# Patient Record
Sex: Female | Born: 1950 | Race: Black or African American | Hispanic: No | Marital: Single | Smoking: Never smoker
Health system: Southern US, Community
[De-identification: ages and names within clinical notes are randomized; demographics above are authoritative.]

## PROBLEM LIST (undated history)

## (undated) DIAGNOSIS — I10 Essential (primary) hypertension: Secondary | ICD-10-CM

## (undated) DIAGNOSIS — K219 Gastro-esophageal reflux disease without esophagitis: Secondary | ICD-10-CM

## (undated) DIAGNOSIS — R112 Nausea with vomiting, unspecified: Secondary | ICD-10-CM

## (undated) DIAGNOSIS — E119 Type 2 diabetes mellitus without complications: Secondary | ICD-10-CM

## (undated) DIAGNOSIS — E0590 Thyrotoxicosis, unspecified without thyrotoxic crisis or storm: Secondary | ICD-10-CM

## (undated) DIAGNOSIS — Z8669 Personal history of other diseases of the nervous system and sense organs: Secondary | ICD-10-CM

## (undated) DIAGNOSIS — E0500 Thyrotoxicosis with diffuse goiter without thyrotoxic crisis or storm: Secondary | ICD-10-CM

## (undated) DIAGNOSIS — Z860100 Personal history of colon polyps, unspecified: Secondary | ICD-10-CM

## (undated) DIAGNOSIS — Z820 Family history of epilepsy and other diseases of the nervous system: Secondary | ICD-10-CM

## (undated) DIAGNOSIS — R7303 Prediabetes: Secondary | ICD-10-CM

## (undated) DIAGNOSIS — I471 Supraventricular tachycardia, unspecified: Secondary | ICD-10-CM

## (undated) DIAGNOSIS — D649 Anemia, unspecified: Secondary | ICD-10-CM

## (undated) DIAGNOSIS — Z9889 Other specified postprocedural states: Secondary | ICD-10-CM

## (undated) DIAGNOSIS — M1990 Unspecified osteoarthritis, unspecified site: Secondary | ICD-10-CM

## (undated) DIAGNOSIS — T7840XA Allergy, unspecified, initial encounter: Secondary | ICD-10-CM

## (undated) DIAGNOSIS — K589 Irritable bowel syndrome without diarrhea: Secondary | ICD-10-CM

## (undated) HISTORY — DX: Unspecified osteoarthritis, unspecified site: M19.90

## (undated) HISTORY — DX: Personal history of colon polyps, unspecified: Z86.0100

## (undated) HISTORY — PX: POLYPECTOMY: SHX149

## (undated) HISTORY — DX: Thyrotoxicosis with diffuse goiter without thyrotoxic crisis or storm: E05.00

## (undated) HISTORY — DX: Irritable bowel syndrome, unspecified: K58.9

## (undated) HISTORY — DX: Family history of epilepsy and other diseases of the nervous system: Z82.0

## (undated) HISTORY — PX: ABDOMINAL HYSTERECTOMY: SHX81

## (undated) HISTORY — PX: TONSILLECTOMY: SUR1361

## (undated) HISTORY — DX: Anemia, unspecified: D64.9

## (undated) HISTORY — DX: Allergy, unspecified, initial encounter: T78.40XA

## (undated) HISTORY — PX: OTHER SURGICAL HISTORY: SHX169

## (undated) HISTORY — PX: COLONOSCOPY: SHX174

## (undated) HISTORY — DX: Gastro-esophageal reflux disease without esophagitis: K21.9

## (undated) HISTORY — DX: Type 2 diabetes mellitus without complications: E11.9

## (undated) HISTORY — DX: Personal history of colonic polyps: Z86.010

## (undated) HISTORY — PX: BREAST BIOPSY: SHX20

## (undated) HISTORY — DX: Thyrotoxicosis, unspecified without thyrotoxic crisis or storm: E05.90

## (undated) HISTORY — DX: Essential (primary) hypertension: I10

---

## 1978-11-24 HISTORY — PX: TUBAL LIGATION: SHX77

## 1998-11-24 HISTORY — PX: CARPAL TUNNEL RELEASE: SHX101

## 2011-08-28 ENCOUNTER — Encounter: Payer: Self-pay | Admitting: Internal Medicine

## 2011-09-18 ENCOUNTER — Encounter: Payer: Self-pay | Admitting: Internal Medicine

## 2011-09-19 ENCOUNTER — Encounter: Payer: Self-pay | Admitting: Internal Medicine

## 2011-09-22 ENCOUNTER — Encounter: Payer: Self-pay | Admitting: *Deleted

## 2011-09-23 ENCOUNTER — Ambulatory Visit (INDEPENDENT_AMBULATORY_CARE_PROVIDER_SITE_OTHER): Payer: BC Managed Care – PPO | Admitting: Internal Medicine

## 2011-09-23 ENCOUNTER — Other Ambulatory Visit (INDEPENDENT_AMBULATORY_CARE_PROVIDER_SITE_OTHER): Payer: BC Managed Care – PPO

## 2011-09-23 ENCOUNTER — Encounter: Payer: Self-pay | Admitting: Internal Medicine

## 2011-09-23 VITALS — BP 142/74 | HR 88 | Ht 61.0 in | Wt 194.2 lb

## 2011-09-23 DIAGNOSIS — D649 Anemia, unspecified: Secondary | ICD-10-CM | POA: Insufficient documentation

## 2011-09-23 DIAGNOSIS — G43909 Migraine, unspecified, not intractable, without status migrainosus: Secondary | ICD-10-CM | POA: Insufficient documentation

## 2011-09-23 DIAGNOSIS — E0500 Thyrotoxicosis with diffuse goiter without thyrotoxic crisis or storm: Secondary | ICD-10-CM | POA: Insufficient documentation

## 2011-09-23 DIAGNOSIS — I10 Essential (primary) hypertension: Secondary | ICD-10-CM | POA: Insufficient documentation

## 2011-09-23 DIAGNOSIS — K219 Gastro-esophageal reflux disease without esophagitis: Secondary | ICD-10-CM | POA: Insufficient documentation

## 2011-09-23 DIAGNOSIS — Z86010 Personal history of colonic polyps: Secondary | ICD-10-CM

## 2011-09-23 LAB — CBC WITH DIFFERENTIAL/PLATELET
Basophils Relative: 1.2 % (ref 0.0–3.0)
Eosinophils Relative: 1.8 % (ref 0.0–5.0)
Lymphocytes Relative: 46.4 % — ABNORMAL HIGH (ref 12.0–46.0)
MCV: 72.2 fl — ABNORMAL LOW (ref 78.0–100.0)
Monocytes Absolute: 0.3 10*3/uL (ref 0.1–1.0)
Monocytes Relative: 8.2 % (ref 3.0–12.0)
Neutrophils Relative %: 42.4 % — ABNORMAL LOW (ref 43.0–77.0)
Platelets: 237 10*3/uL (ref 150.0–400.0)
RBC: 5.07 Mil/uL (ref 3.87–5.11)
WBC: 4.1 10*3/uL — ABNORMAL LOW (ref 4.5–10.5)

## 2011-09-23 LAB — FERRITIN: Ferritin: 85.5 ng/mL (ref 10.0–291.0)

## 2011-09-23 LAB — IBC PANEL
Iron: 58 ug/dL (ref 42–145)
Transferrin: 304.6 mg/dL (ref 212.0–360.0)

## 2011-09-23 MED ORDER — PEG-KCL-NACL-NASULF-NA ASC-C 100 G PO SOLR: 1.0000 | Freq: Once | ORAL | Status: DC

## 2011-09-23 MED ORDER — METOCLOPRAMIDE HCL 10 MG PO TABS: ORAL_TABLET | ORAL | Status: DC

## 2011-09-23 NOTE — Patient Instructions (Signed)
Your physician has requested that you go to the basement for the following lab work before leaving today: IBC panel, celiac Panel and CBC  We have sent the following medications to your pharmacy for you to pick up at your convenience: Moviprep and Reglan  You have been scheduled for a colonoscopy. Please follow written instructions given to you at your visit today.  Please pick up your prep kit at the pharmacy within the next 2-3 days.

## 2011-09-23 NOTE — Progress Notes (Signed)
Subjective:    Patient ID: Denise Ferguson, female    DOB: June 02, 1951, 60 y.o.   MRN: [id number]  HPI Denise Ferguson is a 60 yo female with PMH of Graves' disease, GERD, hypertension, and mild anemia who seen in consultation at the request of Zoe Lan, NP-C for evaluation of anemia with history of colon polyps.  The patient reports she is doing well today. She is having intermittent heartburn, and was recently given a prescription for Dexilant, but her insurance refused to cover this medication. Therefore approximately one week ago she started Nexium 40 mg daily. She has gotten some benefit from this medication already, but still is having mild heartburn symptoms. She denies dysphagia and odynophagia.  She does report intermittent nausea with associated with her heartburn but no vomiting. Appetite is okay and weight has been stable. Regarding her bowel movements, she reports they have been normal. No bright red blood per rectum or melena. She reports a very rarely she'll have lower abdominal cramping and loose stools, but she has attributed this to IBS. Overall this is a rare event for her. Otherwise no abdominal pain. She also reports a long-standing history of anemia, "since I was real young". She reports that she's had low iron levels in the past and in the past having to take iron intermittently. In the past this has caused some mild upset stomach. She reports having hysterectomy in 1989 for endometriosis.  Of note the patient reports having a screening colonoscopy in 2006 in [place], [place].  At this procedure she reports 2 polyps were removed, and she was told these were benign. She is not sure the recommended repeat interval, but remembers a six-month followup which she did not attend.  Review of Systems Constitutional: Negative for fever, chills, night sweats, activity change, appetite change and unexpected weight change HEENT: Negative for sore throat, mouth sores and trouble  swallowing. Eyes: Negative for visual disturbance Respiratory: Negative for chest tightness, positive for shortness of breath and cough Cardiovascular: Negative for chest pain, palpitations and lower extremity swelling Gastrointestinal: See history of present illness Genitourinary: Negative for dysuria and hematuria. Musculoskeletal: Positive for back pain, negative for other arthralgias and myalgias, Positive for muscle cramps Skin: Negative for rash or color change Neurological: Negative for weakness, numbness, positive for headaches Hematological: Negative for adenopathy, negative for easy bruising/bleeding Psychiatric/behavioral: Negative for depressed mood, negative for anxiety  Past Medical History  Diagnosis Date  . FHx: migraine headaches   . Hypertension   . Anemia   . History of colon polyps   . Hyperthyroidism   . Grave's disease   . GERD (gastroesophageal reflux disease)    Current Outpatient Prescriptions  Medication Sig Dispense Refill  . atenolol (TENORMIN) 50 MG tablet Take 50 mg by mouth daily.        Marland Kitchen esomeprazole (NEXIUM) 40 MG capsule Take 40 mg by mouth daily before breakfast.        . estrogens, conjugated, (PREMARIN) 0.9 MG tablet Take 0.9 mg by mouth daily. Take daily for 21 days then do not take for 7 days.       Marland Kitchen levothyroxine (SYNTHROID, LEVOTHROID) 50 MCG tablet Take 50 mcg by mouth every other day.        . levothyroxine (SYNTHROID, LEVOTHROID) 75 MCG tablet Take 75 mcg by mouth every other day.       . losartan-hydrochlorothiazide (HYZAAR) 100-25 MG per tablet Take 1 tablet by mouth daily.        Marland Kitchen  metoCLOPramide (REGLAN) 10 MG tablet Take as directed on prep instructions  4 tablet  0  . peg 3350 powder (MOVIPREP) 100 G SOLR Take 1 kit (100 g total) by mouth once.  1 kit  0   Allergies  Allergen Reactions  . Soma (Carisoprodol)     Nausea, vomiting, swelling  . Ultram (Tramadol Hcl)     vomiting    Social History  . Marital Status: Single     Number of Children: 1   Occupational History  . retired Designer, industrial/product    Social History Main Topics  . Smoking status: Never Smoker   . Smokeless tobacco: Never Used  . Alcohol Use: No  . Drug Use: No    Family History  Problem Relation Age of Onset  . Hypertension Mother   . Diabetes Father   . Lung cancer    . Diabetes Brother   . Hypertension Father   . Alcohol abuse      uncle  . Cancer      uncle       Objective:   Physical Exam BP 142/74  Pulse 88  Ht 5\' 1"  (1.549 m)  Wt 194 lb 3.2 oz (88.089 kg)  BMI 36.69 kg/m2 Constitutional: Well-developed and well-nourished. No distress. HEENT: Normocephalic and atraumatic. Oropharynx is clear and moist. No oropharyngeal exudate. Conjunctivae are normal. Pupils are equal round and reactive to light. No scleral icterus. Neck: Neck supple. Trachea midline. Cardiovascular: Normal rate, regular rhythm and intact distal pulses. No M/R/G Pulmonary/chest: Effort normal and breath sounds normal. No wheezing, rales or rhonchi. Abdominal: Soft, obese, nontender, nondistended. Bowel sounds active throughout. There are no masses palpable. No hepatosplenomegaly. Extremities: no clubbing, cyanosis, or edema Lymphadenopathy: No cervical adenopathy noted. Neurological: Alert and oriented to person place and time. Skin: Skin is warm and dry. No rashes noted. Psychiatric: Normal mood and affect. Behavior is normal.  Labs: 08/27/11 WBC 4.8, hemoglobin 11.8, hematocrit 38.4, platelet 229, MCV 73.8, MCHC 30.8 TSH 5.8, free T4  1.18, free T3 2.4 Sodium 137 potassium 4.4 chloride 101 CO2 32 BUN 14 creatinine 0.9 glucose 89 Total bili 0.5 alk phosphatase 42 AST 25 ALT 20 Albumin 4.1 calcium 9.4 total protein 7.3     Assessment & Plan:  60 yo female with PMH of Graves' disease, GERD, hypertension, and mild anemia who seen in consultation at the request of Zoe Lan, NP-C for evaluation of anemia with history of colon polyps  1. Hx of colon  polyps/anemia -- the patient does have a very mild anemia, and her MCV is low suggesting possible ongoing iron deficiency. She had a hysterectomy in the past and no other source for blood loss or to explain her iron deficiency. One slightly reassuring factor is she states this has been an ongoing problem throughout her lifetime, which suggests a more benign etiology. However, given her history of colon polyps it is very reasonable to repeat colonoscopy at this point. We have requested her procedure and pathology report from 2006. We discussed colonoscopy today and she is willing to proceed. She did state she had some trouble with nausea during her last prep and therefore I will recommend metoclopramide 10 mg 30 minutes to one hour before each half of her prep. If nothing is found on the colonoscopy to explain iron deficiency, then we will consider upper endoscopy. I will repeat his CBC today along with an iron panel and celiac panel.  2. GERD -- the patient's reflux has started to  respond to Nexium 40 mg daily. This is a new medication for her and for now I recommend that she take this medicine for a bit longer before we determine for sure if she is refractory to once daily PPI.  If she does have refractory symptoms, an EGD would be warranted. As stated above EGD may also be warranted for unexplained iron deficiency after colonoscopy.

## 2011-09-24 LAB — TISSUE TRANSGLUTAMINASE, IGA: Tissue Transglutaminase Ab, IgA: 4.2 U/mL (ref <20)

## 2011-09-25 ENCOUNTER — Ambulatory Visit: Payer: BC Managed Care – PPO

## 2011-09-25 DIAGNOSIS — D649 Anemia, unspecified: Secondary | ICD-10-CM

## 2011-09-26 LAB — IGA: IgA: 211 mg/dL (ref 69–380)

## 2011-09-30 ENCOUNTER — Telehealth: Payer: Self-pay | Admitting: *Deleted

## 2011-09-30 NOTE — Telephone Encounter (Signed)
Notified Denise Ferguson of Dr Lauro Franklin findings. Denise Ferguson stated she will decline the Integra for now because it messes up her stomach.

## 2011-09-30 NOTE — Telephone Encounter (Signed)
Message copied by Florene Glen on Tue Sep 30, 2011  3:21 PM ------      Message from: Beverley Fiedler      Created: Sun Sep 28, 2011  9:12 PM       Very very mild anemia with slightly low HCT.  Iron panel is consistent with likely early iron def, but ferritin (iron storage protein) is still normal.      Proceed as discussed in clinic.  Consider Integra once daily.      Celiac panel neg.

## 2011-09-30 NOTE — Telephone Encounter (Signed)
lmom for pt to call back with lab results.

## 2011-10-24 ENCOUNTER — Ambulatory Visit (AMBULATORY_SURGERY_CENTER): Payer: BC Managed Care – PPO | Admitting: Internal Medicine

## 2011-10-24 ENCOUNTER — Encounter: Payer: Self-pay | Admitting: Internal Medicine

## 2011-10-24 VITALS — BP 160/63 | HR 66 | Temp 96.5°F | Resp 24 | Ht 61.0 in | Wt 194.0 lb

## 2011-10-24 DIAGNOSIS — D649 Anemia, unspecified: Secondary | ICD-10-CM

## 2011-10-24 DIAGNOSIS — D126 Benign neoplasm of colon, unspecified: Secondary | ICD-10-CM

## 2011-10-24 MED ORDER — SODIUM CHLORIDE 0.9 % IV SOLN: 500.0000 mL | INTRAVENOUS | Status: DC

## 2011-10-24 NOTE — Patient Instructions (Signed)
Discharge instructions given with verbal understanding. Handouts on polyps, diverticulosis, and a high fiber diet given. Resume previous medications.

## 2011-10-24 NOTE — Progress Notes (Signed)
Patient did not experience any of the following events: a burn prior to discharge; a fall within the facility; wrong site/side/patient/procedure/implant event; or a hospital transfer or hospital admission upon discharge from the facility. (G8907) Patient did not have preoperative order for IV antibiotic SSI prophylaxis. (G8918)  

## 2011-10-24 NOTE — Progress Notes (Signed)
Pt tolerated the colonoscopy very well. maw 

## 2011-10-27 ENCOUNTER — Telehealth: Payer: Self-pay

## 2011-10-27 NOTE — Telephone Encounter (Signed)
Left message

## 2011-10-29 ENCOUNTER — Encounter: Payer: Self-pay | Admitting: Internal Medicine

## 2014-08-29 ENCOUNTER — Encounter: Payer: Self-pay | Admitting: Internal Medicine

## 2015-02-26 ENCOUNTER — Encounter: Payer: Self-pay | Admitting: Internal Medicine

## 2015-03-20 ENCOUNTER — Encounter: Payer: Self-pay | Admitting: Internal Medicine

## 2015-05-18 ENCOUNTER — Ambulatory Visit (AMBULATORY_SURGERY_CENTER): Payer: Self-pay | Admitting: *Deleted

## 2015-05-18 VITALS — Ht 61.75 in | Wt 201.0 lb

## 2015-05-18 DIAGNOSIS — Z86010 Personal history of colonic polyps: Secondary | ICD-10-CM

## 2015-05-18 MED ORDER — NA SULFATE-K SULFATE-MG SULF 17.5-3.13-1.6 GM/177ML PO SOLN: 1.0000 | Freq: Once | ORAL | Status: DC

## 2015-05-18 NOTE — Progress Notes (Signed)
No egg or soy allergy. No anesthesia problems.  No home O2.  No diet meds.  

## 2015-05-31 ENCOUNTER — Ambulatory Visit (AMBULATORY_SURGERY_CENTER): Payer: Medicare HMO | Admitting: Internal Medicine

## 2015-05-31 ENCOUNTER — Encounter: Payer: Self-pay | Admitting: Internal Medicine

## 2015-05-31 VITALS — BP 114/59 | HR 65 | Temp 97.4°F | Resp 17 | Ht 61.0 in | Wt 202.0 lb

## 2015-05-31 DIAGNOSIS — Z860100 Personal history of colon polyps, unspecified: Secondary | ICD-10-CM

## 2015-05-31 DIAGNOSIS — D125 Benign neoplasm of sigmoid colon: Secondary | ICD-10-CM

## 2015-05-31 DIAGNOSIS — K635 Polyp of colon: Secondary | ICD-10-CM

## 2015-05-31 MED ORDER — SODIUM CHLORIDE 0.9 % IV SOLN: 500.0000 mL | INTRAVENOUS | Status: DC

## 2015-05-31 NOTE — Progress Notes (Signed)
Called to room to assist during endoscopic procedure.  Patient ID and intended procedure confirmed with present staff. Received instructions for my participation in the procedure from the performing physician.  

## 2015-05-31 NOTE — Op Note (Signed)
[facility]  [address]. [place], [id number]   COLONOSCOPY PROCEDURE REPORT  PATIENT: Denise Ferguson, Denise Ferguson  MR#: [id number] BIRTHDATE: 1951/03/11 , 28  yrs. old GENDER: female ENDOSCOPIST: Jerene Bears, MD PROCEDURE DATE:  05/31/2015 PROCEDURE:   Colonoscopy, surveillance and Colonoscopy with cold biopsy polypectomy First Screening Colonoscopy - Avg.  risk and is 50 yrs.  old or older - No.  Prior Negative Screening - Now for repeat screening. N/A  History of Adenoma - Now for follow-up colonoscopy & has been > or = to 3 yrs.  Yes hx of adenoma.  Has been 3 or more years since last colonoscopy.  Polyps removed today? Yes ASA CLASS:   Class III INDICATIONS:Screening for colonic neoplasia and PH Colon Adenoma (tubulovillous adenoma 2012). MEDICATIONS: Monitored anesthesia care and Propofol 300 mg IV  DESCRIPTION OF PROCEDURE:   After the risks benefits and alternatives of the procedure were thoroughly explained, informed consent was obtained.  The digital rectal exam revealed no rectal mass.   The LB [id number]  endoscope was introduced through the anus and advanced to the cecum, which was identified by both the appendix and ileocecal valve. No adverse events experienced. The quality of the prep was fair requiring copious irrigation and lavage.  (Suprep was used)  The instrument was then slowly withdrawn as the colon was fully examined. Estimated blood loss is zero unless otherwise noted in this procedure report.   COLON FINDINGS: A sessile polyp measuring 3 mm in size was found in the sigmoid colon.  A polypectomy was performed with cold forceps. The resection was complete, the polyp tissue was completely retrieved and sent to histology.   There was moderate diverticulosis noted in the descending colon and sigmoid colon. Retroflexed views revealed internal hemorrhoids. The time to cecum = 4.2 Withdrawal time = 15.3   The scope was withdrawn and  the procedure completed. COMPLICATIONS: There were no immediate complications.  ENDOSCOPIC IMPRESSION: 1.   Sessile polyp was found in the sigmoid colon; polypectomy was performed with cold forceps 2.   Moderate diverticulosis was noted in the descending colon and sigmoid colon  RECOMMENDATIONS: 1.  Await pathology results 2.  High fiber diet 3.  Repeat Colonoscopy in 5 years. 4.  You will receive a letter within 1-2 weeks with the results of your biopsy as well as final recommendations.  Please call my office if you have not received a letter after 3 weeks.  eSigned:  Jerene Bears, MD 05/31/2015 8:34 AM   cc: Eldridge Abrahams MD and The Patient

## 2015-05-31 NOTE — Patient Instructions (Signed)
YOU HAD AN ENDOSCOPIC PROCEDURE TODAY AT [facility]:   Refer to the procedure report that was given to you for any specific questions about what was found during the examination.  If the procedure report does not answer your questions, please call your gastroenterologist to clarify.  If you requested that your care partner not be given the details of your procedure findings, then the procedure report has been included in a sealed envelope for you to review at your convenience later.  YOU SHOULD EXPECT: Some feelings of bloating in the abdomen. Passage of more gas than usual.  Walking can help get rid of the air that was put into your GI tract during the procedure and reduce the bloating. If you had a lower endoscopy (such as a colonoscopy or flexible sigmoidoscopy) you may notice spotting of blood in your stool or on the toilet paper. If you underwent a bowel prep for your procedure, you may not have a normal bowel movement for a few days.  Please Note:  You might notice some irritation and congestion in your nose or some drainage.  This is from the oxygen used during your procedure.  There is no need for concern and it should clear up in a day or so.  SYMPTOMS TO REPORT IMMEDIATELY:   Following lower endoscopy (colonoscopy or flexible sigmoidoscopy):  Excessive amounts of blood in the stool  Significant tenderness or worsening of abdominal pains  Swelling of the abdomen that is new, acute  Fever of 100F or higher   For urgent or emergent issues, a gastroenterologist can be reached at any hour by calling [phone].   DIET: Your first meal following the procedure should be a small meal and then it is ok to progress to your normal diet. Heavy or fried foods are harder to digest and may make you feel nauseous or bloated.  Likewise, meals heavy in dairy and vegetables can increase bloating.  Drink plenty of fluids but you should avoid alcoholic beverages for 24  hours.  ACTIVITY:  You should plan to take it easy for the rest of today and you should NOT DRIVE or use heavy machinery until tomorrow (because of the sedation medicines used during the test).    FOLLOW UP: Our staff will call the number listed on your records the next business day following your procedure to check on you and address any questions or concerns that you may have regarding the information given to you following your procedure. If we do not reach you, we will leave a message.  However, if you are feeling well and you are not experiencing any problems, there is no need to return our call.  We will assume that you have returned to your regular daily activities without incident.  If any biopsies were taken you will be contacted by phone or by letter within the next 1-3 weeks.  Please call us at [phone] if you have not heard about the biopsies in 3 weeks.    SIGNATURES/CONFIDENTIALITY: You and/or your care partner have signed paperwork which will be entered into your electronic medical record.  These signatures attest to the fact that that the information above on your After Visit Summary has been reviewed and is understood.  Full responsibility of the confidentiality of this discharge information lies with you and/or your care-partner.  Polyp, diverticulosis, high fiber diet-handouts given  Repeat colonoscopy in 5 years 2021.

## 2015-05-31 NOTE — Progress Notes (Signed)
Patient awakening vss, report to rn 

## 2015-06-01 ENCOUNTER — Telehealth: Payer: Self-pay | Admitting: *Deleted

## 2015-06-01 NOTE — Telephone Encounter (Signed)
  Follow up Call-  Call back number 05/31/2015  Post procedure Call Back phone  # [phone]  Permission to leave phone message Yes     Patient questions:  Do you have a fever, pain , or abdominal swelling? No. Pain Score  0 *  Have you tolerated food without any problems? Yes.    Have you been able to return to your normal activities? Yes.    Do you have any questions about your discharge instructions: Diet   No. Medications  No. Follow up visit  No.  Do you have questions or concerns about your Care? No.  Actions: * If pain score is 4 or above: No action needed, pain <4.

## 2015-06-07 ENCOUNTER — Encounter: Payer: Self-pay | Admitting: Internal Medicine

## 2018-09-30 ENCOUNTER — Other Ambulatory Visit: Payer: Self-pay | Admitting: Endocrinology

## 2018-09-30 DIAGNOSIS — E041 Nontoxic single thyroid nodule: Secondary | ICD-10-CM

## 2018-10-13 ENCOUNTER — Ambulatory Visit
Admission: RE | Admit: 2018-10-13 | Discharge: 2018-10-13 | Disposition: A | Discharge status: Home / Self Care | Payer: Medicare HMO | Source: Ambulatory Visit | Attending: Endocrinology | Admitting: Endocrinology

## 2018-10-13 DIAGNOSIS — E041 Nontoxic single thyroid nodule: Secondary | ICD-10-CM

## 2020-06-25 ENCOUNTER — Encounter: Payer: Self-pay | Admitting: Internal Medicine

## 2020-08-16 ENCOUNTER — Ambulatory Visit (AMBULATORY_SURGERY_CENTER): Payer: Self-pay | Admitting: *Deleted

## 2020-08-16 ENCOUNTER — Other Ambulatory Visit: Payer: Self-pay

## 2020-08-16 VITALS — Ht 61.0 in | Wt 203.0 lb

## 2020-08-16 DIAGNOSIS — Z86010 Personal history of colonic polyps: Secondary | ICD-10-CM

## 2020-08-16 MED ORDER — SUPREP BOWEL PREP KIT 17.5-3.13-1.6 GM/177ML PO SOLN: 1.0000 | Freq: Once | ORAL | 0 refills | Status: AC

## 2020-08-16 NOTE — Progress Notes (Signed)
cov vax x 2  No egg or soy allergy known to patient  No issues with past sedation with any surgeries or procedures no intubation problems in the past  No FH of Malignant Hyperthermia No diet pills per patient No home 02 use per patient  No blood thinners per patient  Pt denies issues with constipation  No A fib or A flutter  EMMI video to pt or via [facility] 19 guidelines implemented in PV today with Pt and RN   2 day Suprep due to last colon prep fair with lavage   Due to the COVID-19 pandemic we are asking patients to follow these guidelines. Please only bring one care partner. Please be aware that your care partner may wait in the car in the parking lot or if they feel like they will be too hot to wait in the car, they may wait in the lobby on the 4th floor. All care partners are required to wear a mask the entire time (we do not have any that we can provide them), they need to practice social distancing, and we will do a Covid check for all patient's and care partners when you arrive. Also we will check their temperature and your temperature. If the care partner waits in their car they need to stay in the parking lot the entire time and we will call them on their cell phone when the patient is ready for discharge so they can bring the car to the front of the building. Also all patient's will need to wear a mask into building.

## 2020-08-28 ENCOUNTER — Telehealth: Payer: Self-pay | Admitting: Gastroenterology

## 2020-08-28 NOTE — Telephone Encounter (Signed)
Ulice Dash,  This patient called through the answering service regarding problems with her bowel preparation.  She appears to be taking a 2-day bowel preparation for a colonoscopy with you on 08/30/2020.  After taking Dulcolax around 3 PM and then starting MiraLAX about 5 PM, she began vomiting profusely by around 6 PM and does not think she keep down any of her prep.  I recommended she not consume any more prep, wait about 2 hours and, if vomiting is subsided, try to drink some liquids to rehydrate herself.  I told her you would have nursing call in the morning with further instructions regarding a change in the prep instructions and probable prescription for antiemetics to get through it.  - HD

## 2020-08-29 MED ORDER — METOCLOPRAMIDE HCL 10 MG PO TABS: ORAL_TABLET | ORAL | 0 refills | Status: DC

## 2020-08-29 NOTE — Telephone Encounter (Signed)
Denise Ferguson, can you reach out and try to make a plan to get her through the prep. She may need metoclopramide 10 mg 30 min before each 1/2 of prep

## 2020-08-29 NOTE — Telephone Encounter (Signed)
Spoke with pt and she is aware of plan for reglan 75min before prep doses. Script sent to pharmacy.

## 2020-08-30 ENCOUNTER — Other Ambulatory Visit: Payer: Self-pay

## 2020-08-30 ENCOUNTER — Ambulatory Visit (AMBULATORY_SURGERY_CENTER): Payer: Medicare HMO | Admitting: Internal Medicine

## 2020-08-30 ENCOUNTER — Encounter: Payer: Self-pay | Admitting: Internal Medicine

## 2020-08-30 VITALS — BP 123/56 | HR 66 | Temp 97.6°F | Resp 14 | Ht 61.0 in | Wt 203.0 lb

## 2020-08-30 DIAGNOSIS — K635 Polyp of colon: Secondary | ICD-10-CM

## 2020-08-30 DIAGNOSIS — Z86010 Personal history of colonic polyps: Secondary | ICD-10-CM

## 2020-08-30 DIAGNOSIS — D127 Benign neoplasm of rectosigmoid junction: Secondary | ICD-10-CM

## 2020-08-30 MED ORDER — SODIUM CHLORIDE 0.9 % IV SOLN: 500.0000 mL | Freq: Once | INTRAVENOUS | Status: DC

## 2020-08-30 NOTE — Progress Notes (Signed)
Pt's states no medical or surgical changes since previsit or office visit. 

## 2020-08-30 NOTE — Progress Notes (Signed)
Called to room to assist during endoscopic procedure.  Patient ID and intended procedure confirmed with present staff. Received instructions for my participation in the procedure from the performing physician.  

## 2020-08-30 NOTE — Patient Instructions (Signed)
Handouts provided on polyps, diverticulosis and hemorrhoids.   YOU HAD AN ENDOSCOPIC PROCEDURE TODAY AT THE [facility]:   Refer to the procedure report that was given to you for any specific questions about what was found during the examination.  If the procedure report does not answer your questions, please call your gastroenterologist to clarify.  If you requested that your care partner not be given the details of your procedure findings, then the procedure report has been included in a sealed envelope for you to review at your convenience later.  YOU SHOULD EXPECT: Some feelings of bloating in the abdomen. Passage of more gas than usual.  Walking can help get rid of the air that was put into your GI tract during the procedure and reduce the bloating. If you had a lower endoscopy (such as a colonoscopy or flexible sigmoidoscopy) you may notice spotting of blood in your stool or on the toilet paper. If you underwent a bowel prep for your procedure, you may not have a normal bowel movement for a few days.  Please Note:  You might notice some irritation and congestion in your nose or some drainage.  This is from the oxygen used during your procedure.  There is no need for concern and it should clear up in a day or so.  SYMPTOMS TO REPORT IMMEDIATELY:   Following lower endoscopy (colonoscopy or flexible sigmoidoscopy):  Excessive amounts of blood in the stool  Significant tenderness or worsening of abdominal pains  Swelling of the abdomen that is new, acute  Fever of 100F or higher   For urgent or emergent issues, a gastroenterologist can be reached at any hour by calling [phone]. Do not use MyChart messaging for urgent concerns.    DIET:  We do recommend a small meal at first, but then you may proceed to your regular diet.  Drink plenty of fluids but you should avoid alcoholic beverages for 24 hours.  ACTIVITY:  You should plan to take it easy for the rest of today and  you should NOT DRIVE or use heavy machinery until tomorrow (because of the sedation medicines used during the test).    FOLLOW UP: Our staff will call the number listed on your records 48-72 hours following your procedure to check on you and address any questions or concerns that you may have regarding the information given to you following your procedure. If we do not reach you, we will leave a message.  We will attempt to reach you two times.  During this call, we will ask if you have developed any symptoms of COVID 19. If you develop any symptoms (ie: fever, flu-like symptoms, shortness of breath, cough etc.) before then, please call [phone].  If you test positive for Covid 19 in the 2 weeks post procedure, please call and report this information to us.    If any biopsies were taken you will be contacted by phone or by letter within the next 1-3 weeks.  Please call us at [phone] if you have not heard about the biopsies in 3 weeks.    SIGNATURES/CONFIDENTIALITY: You and/or your care partner have signed paperwork which will be entered into your electronic medical record.  These signatures attest to the fact that that the information above on your After Visit Summary has been reviewed and is understood.  Full responsibility of the confidentiality of this discharge information lies with you and/or your care-partner.  

## 2020-08-30 NOTE — Progress Notes (Signed)
Report to PACU, RN, vss, BBS= Clear.  

## 2020-08-30 NOTE — Op Note (Signed)
[facility] Patient Name: Denise Ferguson Procedure Date: 08/30/2020 9:05 AM MRN: [id number] Endoscopist: Jerene Bears , MD Age: 69 Referring MD:  Date of Birth: Apr 09, 1951 Gender: Female Account #: [id number] Procedure:                Colonoscopy Indications:              High risk colon cancer surveillance: Personal                            history of colonic polyps (TVA 2012, no polyps                            2016), Last colonoscopy: July 2016 Medicines:                Monitored Anesthesia Care Procedure:                Pre-Anesthesia Assessment:                           - Prior to the procedure, a History and Physical                            was performed, and patient medications and                            allergies were reviewed. The patient's tolerance of                            previous anesthesia was also reviewed. The risks                            and benefits of the procedure and the sedation                            options and risks were discussed with the patient.                            All questions were answered, and informed consent                            was obtained. Prior Anticoagulants: The patient has                            taken no previous anticoagulant or antiplatelet                            agents. ASA Grade Assessment: II - A patient with                            mild systemic disease. After reviewing the risks                            and benefits, the patient was deemed in  satisfactory condition to undergo the procedure.                           After obtaining informed consent, the colonoscope                            was passed under direct vision. Throughout the                            procedure, the patient's blood pressure, pulse, and                            oxygen saturations were monitored continuously. The                            Colonoscope was introduced  through the anus and                            advanced to the cecum, identified by appendiceal                            orifice and ileocecal valve. The colonoscopy was                            performed without difficulty. The patient tolerated                            the procedure well. The quality of the bowel                            preparation was good. The ileocecal valve,                            appendiceal orifice, and rectum were photographed. Scope In: 9:08:02 AM Scope Out: 9:23:58 AM Scope Withdrawal Time: 0 hours 11 minutes 58 seconds  Total Procedure Duration: 0 hours 15 minutes 56 seconds  Findings:                 The digital rectal exam was normal.                           A 3 mm polyp was found in the recto-sigmoid colon.                            The polyp was sessile. The polyp was removed with a                            cold snare. Resection and retrieval were complete.                           Multiple small-mouthed diverticula were found in                            the sigmoid colon and descending colon.  Internal hemorrhoids were found during                            retroflexion. The hemorrhoids were small. Complications:            No immediate complications. Estimated Blood Loss:     Estimated blood loss was minimal. Impression:               - One 3 mm polyp at the recto-sigmoid colon,                            removed with a cold snare. Resected and retrieved.                           - Diverticulosis in the sigmoid colon and in the                            descending colon.                           - Small internal hemorrhoids. Recommendation:           - Patient has a contact number available for                            emergencies. The signs and symptoms of potential                            delayed complications were discussed with the                            patient. Return to normal activities  tomorrow.                            Written discharge instructions were provided to the                            patient.                           - Resume previous diet.                           - Continue present medications.                           - Await pathology results.                           - Repeat colonoscopy is recommended. The                            colonoscopy date will be determined after pathology                            results from today's exam become available for  review. Jerene Bears, MD 08/30/2020 9:26:51 AM This report has been signed electronically.

## 2020-09-03 ENCOUNTER — Telehealth: Payer: Self-pay

## 2020-09-03 NOTE — Telephone Encounter (Signed)
°  Follow up Call-  Call back number 08/30/2020  Post procedure Call Back phone  # [phone]  Permission to leave phone message Yes  Some recent data might be hidden     Patient questions:  Do you have a fever, pain , or abdominal swelling? No. Pain Score  0 *  Have you tolerated food without any problems? Yes.    Have you been able to return to your normal activities? Yes.    Do you have any questions about your discharge instructions: Diet   No. Medications  No. Follow up visit  No.  Do you have questions or concerns about your Care? No.  Actions: * If pain score is 4 or above: No action needed, pain <4.  Have you developed a fever since your procedure? No 2.   Have you had an respiratory symptoms (SOB or cough) since your procedure? No  3.   Have you tested positive for COVID 19 since your procedure No  4.   Have you had any family members/close contacts diagnosed with the COVID 19 since your procedure?  No  If yes to any of these questions please route to Joylene John, RN and Joella Prince, RN

## 2020-09-04 ENCOUNTER — Encounter: Payer: Self-pay | Admitting: Internal Medicine

## 2021-03-20 HISTORY — PX: CATARACT EXTRACTION: SUR2

## 2021-05-16 ENCOUNTER — Ambulatory Visit: Payer: Medicare HMO | Admitting: Physician Assistant

## 2021-05-16 ENCOUNTER — Ambulatory Visit (INDEPENDENT_AMBULATORY_CARE_PROVIDER_SITE_OTHER): Payer: Medicare HMO

## 2021-05-16 ENCOUNTER — Encounter: Payer: Self-pay | Admitting: Orthopedic Surgery

## 2021-05-16 DIAGNOSIS — M25561 Pain in right knee: Secondary | ICD-10-CM

## 2021-05-16 MED ORDER — METHYLPREDNISOLONE ACETATE 40 MG/ML IJ SUSP
40.0000 mg | INTRAMUSCULAR | Status: AC | PRN
  Administered 2021-05-16: 40 mg via INTRA_ARTICULAR

## 2021-05-16 MED ORDER — LIDOCAINE HCL 1 % IJ SOLN
5.0000 mL | INTRAMUSCULAR | Status: AC | PRN
  Administered 2021-05-16: 5 mL

## 2021-05-16 NOTE — Progress Notes (Signed)
Office Visit Note   Patient: Denise Ferguson           Date of Birth: [birth year]           MRN: [id number] Visit Date: 05/16/2021              Requested by: [name], NP [place],  [place] [address] PCP: [name], NP  Chief Complaint  Patient presents with  . Right Knee - Pain      HPI: Patient is a pleasant 70 year old woman with a chief complaint of right knee pain.  She has a history of ongoing left knee problems but her left knee is not currently very symptomatic.  She has difficulty with stairs.  She sometimes feels popping in the joint.  Sometimes when she rolls a certain way in bed she awakens with pain.  She is prediabetic and takes metformin  Assessment & Plan: Visit Diagnoses:  1. Right knee pain, unspecified chronicity     Plan: We will follow-up in 3 weeks.  I have demonstrated to her close chain quadricep strengthening exercises.  We will go forward with an injection into the right knee today.  If she had persistent mechanical symptoms would recommend an MRI  Follow-Up Instructions: No follow-ups on file.   Ortho Exam  Patient is alert, oriented, no adenopathy, well-dressed, normal affect, normal respiratory effort. Right knee: No effusion mild soft tissue swelling she is tender over the medial and lateral joint line.  No signs of infection.  Endpoint on anterior draw.  Her compartments are soft and nontender.  Good dorsiflexion plantarflexion of her foot  Imaging: No results found. No images are attached to the encounter.  Labs: No results found for: HGBA1C, ESRSEDRATE, CRP, LABURIC, REPTSTATUS, GRAMSTAIN, CULT, LABORGA   No results found for: ALBUMIN, PREALBUMIN, CBC  No results found for: MG No results found for: VD25OH  No results found for: PREALBUMIN CBC EXTENDED Latest Ref Rng & Units 09/23/2011  WBC 4.5 - 10.5 K/uL 4.1(L)  RBC 3.87 - 5.11 Mil/uL 5.07  HGB 12.0 - 15.0 g/dL 11.8(L)  HCT 36.0 - 46.0 % 36.6   PLT 150.0 - 400.0 K/uL 237.0  NEUTROABS 1.4 - 7.7 K/uL 1.8  LYMPHSABS 0.7 - 4.0 K/uL 1.9     There is no height or weight on file to calculate BMI.  Orders:  Orders Placed This Encounter  Procedures  . XR Knee 1-2 Views Right   No orders of the defined types were placed in this encounter.    Procedures: Large Joint Inj: R knee on 05/16/2021 2:36 PM Indications: pain and diagnostic evaluation Details: 22 G 1.5 in needle, anterolateral approach  Arthrogram: No  Medications: 40 mg methylPREDNISolone acetate 40 MG/ML; 5 mL lidocaine 1 % Outcome: tolerated well, no immediate complications Procedure, treatment alternatives, risks and benefits explained, specific risks discussed. Consent was given by the patient.     Clinical Data: No additional findings.  ROS:  All other systems negative, except as noted in the HPI. Review of Systems  Objective: Vital Signs: There were no vitals taken for this visit.  Specialty Comments:  No specialty comments available.  PMFS History: Patient Active Problem List   Diagnosis Date Noted  . History of colon polyps 09/23/2011  . GERD (gastroesophageal reflux disease) 09/23/2011  . Grave's disease 09/23/2011  . HTN (hypertension) 09/23/2011  . Migraines 09/23/2011  . Anemia 09/23/2011   Past Medical History:  Diagnosis Date  .  Allergy   . Anemia   . Arthritis    knees  . Diabetes mellitus without complication ([place])    " Pre" DM- on Metformin   . FHx: migraine headaches   . GERD (gastroesophageal reflux disease)   . Grave's disease   . History of colon polyps   . Hypertension   . Hyperthyroidism   . IBS (irritable bowel syndrome)     Family History  Problem Relation Age of Onset  . Hypertension Mother   . Diabetes Father   . Hypertension Father   . Lung cancer Other   . Diabetes Brother   . Alcohol abuse Other        uncle  . Cancer Other        uncle  . Colon cancer Neg Hx   . Colon polyps Neg Hx   . Esophageal  cancer Neg Hx   . Rectal cancer Neg Hx   . Stomach cancer Neg Hx     Past Surgical History:  Procedure Laterality Date  . ABDOMINAL HYSTERECTOMY    . BREAST BIOPSY    . CARPAL TUNNEL RELEASE  2000   bilateral  . COLONOSCOPY    . POLYPECTOMY    . thyroid ablation     x2  . TONSILLECTOMY    . TUBAL LIGATION  1980   Social History   Occupational History  . Occupation: retired Quarry manager  Tobacco Use  . Smoking status: Never  . Smokeless tobacco: Never  Substance and Sexual Activity  . Alcohol use: No    Alcohol/week: 0.0 standard drinks  . Drug use: No  . Sexual activity: Not on file

## 2021-06-10 ENCOUNTER — Ambulatory Visit (INDEPENDENT_AMBULATORY_CARE_PROVIDER_SITE_OTHER): Payer: Medicare HMO | Admitting: Physician Assistant

## 2021-06-10 DIAGNOSIS — M25561 Pain in right knee: Secondary | ICD-10-CM

## 2021-06-10 NOTE — Progress Notes (Signed)
Office Visit Note   Patient: Denise Ferguson           Date of Birth: [birth year]           MRN: [id number] Visit Date: 06/10/2021              Requested by: [name], NP [place],  [place] [address] PCP: [name], NP  Chief Complaint  Patient presents with   Right Knee - Pain, Follow-up      HPI: Patient is a pleasant 70 year old woman with right knee pain.  At her last visit she was given an injection.  She was also told to try using Voltaren gel on her knee.  Also instructed in close chain quadricep strengthening exercises.  She has had very little relief despite this.  She also has episodes of her knee locking up and feelings of catching within the knee itself.  This causes her some instability  Assessment & Plan: Visit Diagnoses:  1. Right knee pain, unspecified chronicity     Plan: Given her mechanical symptoms I will order an MRI we will follow-up with Dr. Sharol Given once this is complete.  I did discuss with her that for stability she could purchase a knee sleeve at a sporting goods store that may make her feel more comfortable  Follow-Up Instructions: No follow-ups on file.   Ortho Exam  Patient is alert, oriented, no adenopathy, well-dressed, normal affect, normal respiratory effort. Examination of the right knee demonstrates no effusion no swelling no cellulitis.  She does have tenderness over the medial and lateral joint line.  Some tenderness posteriorly.  She does have some popping with extension and flexion of her knee  Imaging: No results found. No images are attached to the encounter.  Labs: No results found for: HGBA1C, ESRSEDRATE, CRP, LABURIC, REPTSTATUS, GRAMSTAIN, CULT, LABORGA   No results found for: ALBUMIN, PREALBUMIN, CBC  No results found for: MG No results found for: VD25OH  No results found for: PREALBUMIN CBC EXTENDED Latest Ref Rng & Units 09/23/2011  WBC 4.5 - 10.5 K/uL 4.1(L)  RBC 3.87 - 5.11 Mil/uL  5.07  HGB 12.0 - 15.0 g/dL 11.8(L)  HCT 36.0 - 46.0 % 36.6  PLT 150.0 - 400.0 K/uL 237.0  NEUTROABS 1.4 - 7.7 K/uL 1.8  LYMPHSABS 0.7 - 4.0 K/uL 1.9     There is no height or weight on file to calculate BMI.  Orders:  Orders Placed This Encounter  Procedures   MR Knee Right w/o contrast   No orders of the defined types were placed in this encounter.    Procedures: No procedures performed  Clinical Data: No additional findings.  ROS:  All other systems negative, except as noted in the HPI. Review of Systems  Objective: Vital Signs: There were no vitals taken for this visit.  Specialty Comments:  No specialty comments available.  PMFS History: Patient Active Problem List   Diagnosis Date Noted   History of colon polyps 09/23/2011   GERD (gastroesophageal reflux disease) 09/23/2011   Grave's disease 09/23/2011   HTN (hypertension) 09/23/2011   Migraines 09/23/2011   Anemia 09/23/2011   Past Medical History:  Diagnosis Date   Allergy    Anemia    Arthritis    knees   Diabetes mellitus without complication ([place])    " Pre" DM- on Metformin    FHx: migraine headaches    GERD (gastroesophageal reflux disease)    Grave's  disease    History of colon polyps    Hypertension    Hyperthyroidism    IBS (irritable bowel syndrome)     Family History  Problem Relation Age of Onset   Hypertension Mother    Diabetes Father    Hypertension Father    Lung cancer Other    Diabetes Brother    Alcohol abuse Other        uncle   Cancer Other        uncle   Colon cancer Neg Hx    Colon polyps Neg Hx    Esophageal cancer Neg Hx    Rectal cancer Neg Hx    Stomach cancer Neg Hx     Past Surgical History:  Procedure Laterality Date   ABDOMINAL HYSTERECTOMY     BREAST BIOPSY     CARPAL TUNNEL RELEASE  2000   bilateral   COLONOSCOPY     POLYPECTOMY     thyroid ablation     x2   TONSILLECTOMY     TUBAL LIGATION  1980   Social History   Occupational History    Occupation: retired Quarry manager  Tobacco Use   Smoking status: Never   Smokeless tobacco: Never  Substance and Sexual Activity   Alcohol use: No    Alcohol/week: 0.0 standard drinks   Drug use: No   Sexual activity: Not on file

## 2021-06-18 ENCOUNTER — Ambulatory Visit
Admission: RE | Admit: 2021-06-18 | Discharge: 2021-06-18 | Disposition: A | Discharge status: Home / Self Care | Payer: Medicare HMO | Source: Ambulatory Visit | Attending: Physician Assistant | Admitting: Physician Assistant

## 2021-06-18 ENCOUNTER — Other Ambulatory Visit: Payer: Medicare HMO

## 2021-06-18 ENCOUNTER — Other Ambulatory Visit: Payer: Self-pay

## 2021-06-18 DIAGNOSIS — M25561 Pain in right knee: Secondary | ICD-10-CM

## 2021-06-20 ENCOUNTER — Ambulatory Visit: Payer: Medicare HMO | Admitting: Orthopedic Surgery

## 2021-06-20 DIAGNOSIS — M23203 Derangement of unspecified medial meniscus due to old tear or injury, right knee: Secondary | ICD-10-CM | POA: Diagnosis not present

## 2021-06-27 ENCOUNTER — Encounter: Payer: Self-pay | Admitting: Orthopedic Surgery

## 2021-06-27 NOTE — Progress Notes (Signed)
Office Visit Note   Patient: Denise Ferguson           Date of Birth: 03-26-1951           MRN: [id number] Visit Date: 06/20/2021              Requested by: [name], NP [place],  [place] [address] PCP: [name], NP  Chief Complaint  Patient presents with   Right Knee - Follow-up      HPI: Patient is a 70 year old woman who presents status post right knee MRI scan.  She complains of mechanical symptoms with her right knee pain with activities of daily living.  Assessment & Plan: Visit Diagnoses:  1. 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  Discussed risk and benefits of surgery discussed that with arthroscopy her pain from the meniscal tears should resolve however she would still have pain from the osteoarthritis patient states she understands wished to proceed at this time.  Follow-Up Instructions: Return if symptoms worsen or fail to improve.   Ortho Exam  Patient is alert, oriented, no adenopathy, well-dressed, normal affect, normal respiratory effort. Examination patient is crepitation with range of motion of the right knee collaterals and cruciates are stable.  She is tender to palpation over the medial and lateral joint line as well as the patellofemoral joint.  She has an antalgic gait.  Review of the MRI scan shows medial and lateral meniscal tears as well as osteoarthritis of the knee.  Imaging: No results found. No images are attached to the encounter.  Labs: No results found for: HGBA1C, ESRSEDRATE, CRP, LABURIC, REPTSTATUS, GRAMSTAIN, CULT, LABORGA   No results found for: ALBUMIN, PREALBUMIN, CBC  No results found for: MG No results found for: VD25OH  No results found for: PREALBUMIN CBC EXTENDED Latest Ref Rng & Units 09/23/2011  WBC 4.5 - 10.5 K/uL  4.1(L)  RBC 3.87 - 5.11 Mil/uL 5.07  HGB 12.0 - 15.0 g/dL 11.8(L)  HCT 36.0 - 46.0 % 36.6  PLT 150.0 - 400.0 K/uL 237.0  NEUTROABS 1.4 - 7.7 K/uL 1.8  LYMPHSABS 0.7 - 4.0 K/uL 1.9     There is no height or weight on file to calculate BMI.  Orders:  No orders of the defined types were placed in this encounter.  No orders of the defined types were placed in this encounter.    Procedures: No procedures performed  Clinical Data: No additional findings.  ROS:  All other systems negative, except as noted in the HPI. Review of Systems  Objective: Vital Signs: There were no vitals taken for this visit.  Specialty Comments:  No specialty comments available.  PMFS History: Patient Active Problem List   Diagnosis Date Noted   History of colon polyps 09/23/2011   GERD (gastroesophageal reflux disease) 09/23/2011   Grave's disease 09/23/2011   HTN (hypertension) 09/23/2011   Migraines 09/23/2011   Anemia 09/23/2011   Past Medical History:  Diagnosis Date   Allergy    Anemia    Arthritis    knees   Diabetes mellitus without complication ([place])    " Pre" DM- on Metformin    FHx: migraine headaches    GERD (gastroesophageal reflux disease)    Grave's disease    History of colon polyps  Hypertension    Hyperthyroidism    IBS (irritable bowel syndrome)     Family History  Problem Relation Age of Onset   Hypertension Mother    Diabetes Father    Hypertension Father    Lung cancer Other    Diabetes Brother    Alcohol abuse Other        uncle   Cancer Other        uncle   Colon cancer Neg Hx    Colon polyps Neg Hx    Esophageal cancer Neg Hx    Rectal cancer Neg Hx    Stomach cancer Neg Hx     Past Surgical History:  Procedure Laterality Date   ABDOMINAL HYSTERECTOMY     BREAST BIOPSY     CARPAL TUNNEL RELEASE  2000   bilateral   COLONOSCOPY     POLYPECTOMY     thyroid ablation     x2   TONSILLECTOMY     TUBAL LIGATION  1980   Social History    Occupational History   Occupation: retired Quarry manager  Tobacco Use   Smoking status: Never   Smokeless tobacco: Never  Substance and Sexual Activity   Alcohol use: No    Alcohol/week: 0.0 standard drinks   Drug use: No   Sexual activity: Not on file

## 2021-07-02 ENCOUNTER — Other Ambulatory Visit: Payer: Self-pay

## 2021-07-09 ENCOUNTER — Encounter (HOSPITAL_BASED_OUTPATIENT_CLINIC_OR_DEPARTMENT_OTHER): Payer: Self-pay | Admitting: Orthopedic Surgery

## 2021-07-09 ENCOUNTER — Other Ambulatory Visit: Payer: Self-pay

## 2021-07-09 ENCOUNTER — Other Ambulatory Visit: Payer: Self-pay | Admitting: Physician Assistant

## 2021-07-12 ENCOUNTER — Encounter (HOSPITAL_BASED_OUTPATIENT_CLINIC_OR_DEPARTMENT_OTHER)
Admission: RE | Admit: 2021-07-12 | Discharge: 2021-07-12 | Disposition: A | Discharge status: Home / Self Care | Payer: Medicare HMO | Source: Ambulatory Visit | Attending: Orthopedic Surgery | Admitting: Orthopedic Surgery

## 2021-07-12 DIAGNOSIS — Z01818 Encounter for other preprocedural examination: Secondary | ICD-10-CM | POA: Diagnosis present

## 2021-07-12 LAB — BASIC METABOLIC PANEL
Anion gap: 7 (ref 5–15)
BUN: 16 mg/dL (ref 8–23)
CO2: 30 mmol/L (ref 22–32)
Calcium: 9.1 mg/dL (ref 8.9–10.3)
Chloride: 102 mmol/L (ref 98–111)
Creatinine, Ser: 0.84 mg/dL (ref 0.44–1.00)
GFR, Estimated: >60 mL/min (ref >60)
Glucose, Bld: 99 mg/dL (ref 70–99)
Potassium: 4.1 mmol/L (ref 3.5–5.1)
Sodium: 139 mmol/L (ref 135–145)

## 2021-07-16 ENCOUNTER — Ambulatory Visit (HOSPITAL_BASED_OUTPATIENT_CLINIC_OR_DEPARTMENT_OTHER): Payer: Medicare HMO | Admitting: Anesthesiology

## 2021-07-16 ENCOUNTER — Encounter (HOSPITAL_BASED_OUTPATIENT_CLINIC_OR_DEPARTMENT_OTHER): Payer: Self-pay | Admitting: Orthopedic Surgery

## 2021-07-16 ENCOUNTER — Ambulatory Visit (HOSPITAL_BASED_OUTPATIENT_CLINIC_OR_DEPARTMENT_OTHER)
Admission: RE | Admit: 2021-07-16 | Discharge: 2021-07-16 | Disposition: A | Discharge status: Home / Self Care | Payer: Medicare HMO | Attending: Orthopedic Surgery | Admitting: Orthopedic Surgery

## 2021-07-16 ENCOUNTER — Other Ambulatory Visit: Payer: Self-pay

## 2021-07-16 ENCOUNTER — Encounter (HOSPITAL_BASED_OUTPATIENT_CLINIC_OR_DEPARTMENT_OTHER)
Admission: RE | Disposition: A | Discharge status: Home / Self Care | Payer: Self-pay | Source: Home / Self Care | Attending: Orthopedic Surgery

## 2021-07-16 DIAGNOSIS — Z91040 Latex allergy status: Secondary | ICD-10-CM | POA: Diagnosis not present

## 2021-07-16 DIAGNOSIS — M1990 Unspecified osteoarthritis, unspecified site: Secondary | ICD-10-CM | POA: Diagnosis not present

## 2021-07-16 DIAGNOSIS — Z885 Allergy status to narcotic agent status: Secondary | ICD-10-CM | POA: Insufficient documentation

## 2021-07-16 DIAGNOSIS — S83281A Other tear of lateral meniscus, current injury, right knee, initial encounter: Secondary | ICD-10-CM | POA: Diagnosis not present

## 2021-07-16 DIAGNOSIS — Z87828 Personal history of other (healed) physical injury and trauma: Secondary | ICD-10-CM | POA: Diagnosis not present

## 2021-07-16 DIAGNOSIS — Z888 Allergy status to other drugs, medicaments and biological substances status: Secondary | ICD-10-CM | POA: Insufficient documentation

## 2021-07-16 DIAGNOSIS — S83241A Other tear of medial meniscus, current injury, right knee, initial encounter: Secondary | ICD-10-CM | POA: Insufficient documentation

## 2021-07-16 DIAGNOSIS — X58XXXA Exposure to other specified factors, initial encounter: Secondary | ICD-10-CM | POA: Insufficient documentation

## 2021-07-16 DIAGNOSIS — R7303 Prediabetes: Secondary | ICD-10-CM | POA: Diagnosis not present

## 2021-07-16 HISTORY — DX: Other specified postprocedural states: Z98.890

## 2021-07-16 HISTORY — PX: KNEE ARTHROSCOPY WITH MEDIAL MENISECTOMY: SHX5651

## 2021-07-16 HISTORY — PX: KNEE ARTHROSCOPY WITH LATERAL MENISECTOMY: SHX6193

## 2021-07-16 HISTORY — DX: Nausea with vomiting, unspecified: R11.2

## 2021-07-16 LAB — GLUCOSE, CAPILLARY
Glucose-Capillary: 91 mg/dL (ref 70–99)
Glucose-Capillary: 109 mg/dL — ABNORMAL HIGH (ref 70–99)

## 2021-07-16 SURGERY — ARTHROSCOPY, KNEE, WITH MEDIAL MENISCECTOMY
Anesthesia: General | Site: Knee | Laterality: Right

## 2021-07-16 MED ORDER — PROPOFOL 10 MG/ML IV BOLUS
INTRAVENOUS | Status: AC
  Filled 2021-07-16: qty 40

## 2021-07-16 MED ORDER — ACETAMINOPHEN 10 MG/ML IV SOLN: 1000.0000 mg | Freq: Once | INTRAVENOUS | Status: DC | PRN

## 2021-07-16 MED ORDER — DEXAMETHASONE SODIUM PHOSPHATE 10 MG/ML IJ SOLN
INTRAMUSCULAR | Status: AC
  Filled 2021-07-16: qty 1

## 2021-07-16 MED ORDER — BUPIVACAINE HCL (PF) 0.5 % IJ SOLN
INTRAMUSCULAR | Status: AC
  Filled 2021-07-16: qty 120

## 2021-07-16 MED ORDER — APREPITANT 40 MG PO CAPS
ORAL_CAPSULE | ORAL | Status: AC
  Filled 2021-07-16: qty 1

## 2021-07-16 MED ORDER — FENTANYL CITRATE (PF) 100 MCG/2ML IJ SOLN
INTRAMUSCULAR | Status: DC | PRN
  Administered 2021-07-16: 100 ug via INTRAVENOUS

## 2021-07-16 MED ORDER — CEFAZOLIN SODIUM-DEXTROSE 2-4 GM/100ML-% IV SOLN
INTRAVENOUS | Status: AC
  Filled 2021-07-16: qty 100

## 2021-07-16 MED ORDER — SODIUM CHLORIDE 0.9 % IR SOLN
Status: DC | PRN
  Administered 2021-07-16: 7500 mL

## 2021-07-16 MED ORDER — LIDOCAINE HCL (CARDIAC) PF 100 MG/5ML IV SOSY
PREFILLED_SYRINGE | INTRAVENOUS | Status: DC | PRN
  Administered 2021-07-16: 60 mg via INTRAVENOUS

## 2021-07-16 MED ORDER — EPHEDRINE SULFATE 50 MG/ML IJ SOLN
INTRAMUSCULAR | Status: DC | PRN
  Administered 2021-07-16: 10 mg via INTRAVENOUS

## 2021-07-16 MED ORDER — APREPITANT 40 MG PO CAPS
40.0000 mg | ORAL_CAPSULE | Freq: Once | ORAL | Status: AC
  Administered 2021-07-16: 40 mg via ORAL

## 2021-07-16 MED ORDER — LACTATED RINGERS IV SOLN: INTRAVENOUS | Status: DC

## 2021-07-16 MED ORDER — OXYCODONE-ACETAMINOPHEN 5-325 MG PO TABS: 1.0000 | ORAL_TABLET | ORAL | 0 refills | Status: DC | PRN

## 2021-07-16 MED ORDER — FENTANYL CITRATE (PF) 100 MCG/2ML IJ SOLN
INTRAMUSCULAR | Status: AC
  Filled 2021-07-16: qty 2

## 2021-07-16 MED ORDER — BUPIVACAINE HCL (PF) 0.5 % IJ SOLN
INTRAMUSCULAR | Status: DC | PRN
  Administered 2021-07-16: 20 mL via INTRA_ARTICULAR

## 2021-07-16 MED ORDER — FENTANYL CITRATE (PF) 100 MCG/2ML IJ SOLN: 25.0000 ug | INTRAMUSCULAR | Status: DC | PRN

## 2021-07-16 MED ORDER — PROPOFOL 10 MG/ML IV BOLUS
INTRAVENOUS | Status: DC | PRN
  Administered 2021-07-16: 100 mg via INTRAVENOUS

## 2021-07-16 MED ORDER — AMISULPRIDE (ANTIEMETIC) 5 MG/2ML IV SOLN: 10.0000 mg | Freq: Once | INTRAVENOUS | Status: DC | PRN

## 2021-07-16 MED ORDER — ONDANSETRON HCL 4 MG/2ML IJ SOLN
INTRAMUSCULAR | Status: DC | PRN
  Administered 2021-07-16: 4 mg via INTRAVENOUS

## 2021-07-16 MED ORDER — CEFAZOLIN SODIUM-DEXTROSE 2-4 GM/100ML-% IV SOLN: 2.0000 g | INTRAVENOUS | Status: DC

## 2021-07-16 MED ORDER — ONDANSETRON HCL 4 MG/2ML IJ SOLN
INTRAMUSCULAR | Status: AC
  Filled 2021-07-16: qty 2

## 2021-07-16 MED ORDER — DEXAMETHASONE SODIUM PHOSPHATE 10 MG/ML IJ SOLN
INTRAMUSCULAR | Status: DC | PRN
  Administered 2021-07-16: 5 mg via INTRAVENOUS

## 2021-07-16 MED ORDER — LIDOCAINE HCL (PF) 2 % IJ SOLN
INTRAMUSCULAR | Status: AC
  Filled 2021-07-16: qty 5

## 2021-07-16 MED ORDER — CEFAZOLIN SODIUM-DEXTROSE 2-3 GM-%(50ML) IV SOLR
INTRAVENOUS | Status: DC | PRN
  Administered 2021-07-16: 2 g via INTRAVENOUS

## 2021-07-16 MED ORDER — LACTATED RINGERS IV SOLN: INTRAVENOUS | Status: DC | PRN

## 2021-07-16 SURGICAL SUPPLY — 31 items
BLADE EXCALIBUR 4.0X13 (MISCELLANEOUS) ×3 IMPLANT
BNDG COHESIVE 6X5 TAN ST LF (GAUZE/BANDAGES/DRESSINGS) ×3 IMPLANT
DISSECTOR 4.0MM X 13CM (MISCELLANEOUS) IMPLANT
DRAPE ARTHROSCOPY W/POUCH 90 (DRAPES) ×3 IMPLANT
DRAPE U-SHAPE 47X51 STRL (DRAPES) ×3 IMPLANT
DRSG EMULSION OIL 3X3 NADH (GAUZE/BANDAGES/DRESSINGS) ×3 IMPLANT
DURAPREP 26ML APPLICATOR (WOUND CARE) ×3 IMPLANT
DW OUTFLOW CASSETTE/TUBE SET (MISCELLANEOUS) IMPLANT
EXCALIBUR 3.8MM X 13CM (MISCELLANEOUS) IMPLANT
GAUZE SPONGE 4X4 12PLY STRL (GAUZE/BANDAGES/DRESSINGS) ×3 IMPLANT
GLOVE SURG ENC MOIS LTX SZ6.5 (GLOVE) ×3 IMPLANT
GLOVE SURG ORTHO LTX SZ9 (GLOVE) IMPLANT
GLOVE SURG POLYISO LF SZ9 (GLOVE) ×3 IMPLANT
GLOVE SURG UNDER POLY LF SZ7 (GLOVE) ×6 IMPLANT
GLOVE SURG UNDER POLY LF SZ9 (GLOVE) ×3 IMPLANT
GOWN STRL REUS W/ TWL LRG LVL3 (GOWN DISPOSABLE) ×2 IMPLANT
GOWN STRL REUS W/ TWL XL LVL3 (GOWN DISPOSABLE) ×2 IMPLANT
GOWN STRL REUS W/TWL LRG LVL3 (GOWN DISPOSABLE) ×3
GOWN STRL REUS W/TWL XL LVL3 (GOWN DISPOSABLE) ×3
IV NS IRRIG 3000ML ARTHROMATIC (IV SOLUTION) ×9 IMPLANT
MANIFOLD NEPTUNE II (INSTRUMENTS) ×3 IMPLANT
NDL SAFETY ECLIPSE 18X1.5 (NEEDLE) ×2 IMPLANT
NEEDLE HYPO 18GX1.5 SHARP (NEEDLE) ×3
PACK ARTHROSCOPY DSU (CUSTOM PROCEDURE TRAY) ×3 IMPLANT
PACK BASIN DAY SURGERY FS (CUSTOM PROCEDURE TRAY) ×3 IMPLANT
PORT APPOLLO RF 90DEGREE MULTI (SURGICAL WAND) IMPLANT
PROBE APOLLO 90XL (SURGICAL WAND) ×3 IMPLANT
SUT ETHILON 2 0 FSLX (SUTURE) ×3 IMPLANT
TOWEL GREEN STERILE FF (TOWEL DISPOSABLE) ×3 IMPLANT
TUBING ARTHROSCOPY IRRIG 16FT (MISCELLANEOUS) ×3 IMPLANT
WRAP KNEE MAXI GEL POST OP (GAUZE/BANDAGES/DRESSINGS) ×3 IMPLANT

## 2021-07-16 NOTE — Anesthesia Preprocedure Evaluation (Signed)
Anesthesia Evaluation  Patient identified by MRN, date of birth, ID band Patient awake    Reviewed: Allergy & Precautions, NPO status , Patient's Chart, lab work & pertinent test results  History of Anesthesia Complications (+) PONV  Airway Mallampati: II  TM Distance: >3 FB Neck ROM: Full    Dental  (+) Teeth Intact   Pulmonary neg pulmonary ROS,    Pulmonary exam normal        Cardiovascular hypertension, Pt. on medications  Rhythm:Regular Rate:Normal     Neuro/Psych  Headaches, negative psych ROS   GI/Hepatic Neg liver ROS, GERD  Medicated,  Endo/Other  diabetes, Type 2, Oral Hypoglycemic AgentsHyperthyroidism   Renal/GU negative Renal ROS  negative genitourinary   Musculoskeletal  (+) Arthritis , Osteoarthritis,    Abdominal (+)  Abdomen: soft. Bowel sounds: normal.  Peds  Hematology  (+) anemia ,   Anesthesia Other Findings   Reproductive/Obstetrics                             Anesthesia Physical Anesthesia Plan  ASA: 2  Anesthesia Plan: General   Post-op Pain Management:    Induction: Intravenous  PONV Risk Score and Plan: 4 or greater and Ondansetron, Aprepitant, Dexamethasone, Treatment may vary due to age or medical condition and Amisulpride  Airway Management Planned: Mask and LMA  Additional Equipment: None  Intra-op Plan:   Post-operative Plan: Extubation in OR  Informed Consent: I have reviewed the patients History and Physical, chart, labs and discussed the procedure including the risks, benefits and alternatives for the proposed anesthesia with the patient or authorized representative who has indicated his/her understanding and acceptance.     Dental advisory given  Plan Discussed with: CRNA  Anesthesia Plan Comments:         Anesthesia Quick Evaluation

## 2021-07-16 NOTE — Discharge Instructions (Signed)

## 2021-07-16 NOTE — Transfer of Care (Signed)
Immediate Anesthesia Transfer of Care Note  Patient: Steele Bail  Procedure(s) Performed: ARTHROSCOPIC DEBRIDEMENT RIGHT KNEE, PARTIAL MEDIAL AND LATERAL MENISECTOMIES (Right: Knee) KNEE ARTHROSCOPY WITH LATERAL MENISECTOMY (Knee)  Patient Location: PACU  Anesthesia Type:General  Level of Consciousness: awake, alert  and oriented  Airway & Oxygen Therapy: Patient Spontanous Breathing and Patient connected to face mask oxygen  Post-op Assessment: Report given to RN and Post -op Vital signs reviewed and stable  Post vital signs: Reviewed and stable  Last Vitals:  Vitals Value Taken Time  BP    Temp    Pulse    Resp    SpO2      Last Pain:  Vitals:   07/16/21 0727  TempSrc: Oral  PainSc: 0-No pain      Patients Stated Pain Goal: 2 ([id number])  Complications: No notable events documented.

## 2021-07-16 NOTE — H&P (Signed)
Denise Ferguson is an 70 y.o. female.   Chief Complaint: Right Knee Pain HPI: Patient is a 70 year old woman who presents status post right knee MRI scan.  She complains of mechanical symptoms with her right knee pain with activities of daily living.    Past Medical History:  Diagnosis Date   Allergy    Anemia    Arthritis    knees   Diabetes mellitus without complication ([place])    " Pre" DM- on Metformin    FHx: migraine headaches    GERD (gastroesophageal reflux disease)    Grave's disease    History of colon polyps    Hypertension    Hyperthyroidism    IBS (irritable bowel syndrome)    PONV (postoperative nausea and vomiting)     Past Surgical History:  Procedure Laterality Date   ABDOMINAL HYSTERECTOMY     BREAST BIOPSY     CARPAL TUNNEL RELEASE  2000   bilateral   COLONOSCOPY     POLYPECTOMY     thyroid ablation     x2   TONSILLECTOMY     TUBAL LIGATION  1980    Family History  Problem Relation Age of Onset   Hypertension Mother    Diabetes Father    Hypertension Father    Lung cancer Other    Diabetes Brother    Alcohol abuse Other        uncle   Cancer Other        uncle   Colon cancer Neg Hx    Colon polyps Neg Hx    Esophageal cancer Neg Hx    Rectal cancer Neg Hx    Stomach cancer Neg Hx    Social History:  reports that she has never smoked. She has never used smokeless tobacco. She reports that she does not drink alcohol and does not use drugs.  Allergies:  Allergies  Allergen Reactions   Black Cohosh Anaphylaxis   Hydrocodone-Acetaminophen     Other reaction(s): GI Upset (intolerance) Other reaction(s): GI Upset (intolerance) Other reaction(s): GI Upset (intolerance)    Latex    Molds & Smuts     Other reaction(s): Other (See Comments) Sneezing   Pollen Extract     Other reaction(s): Other (See Comments) Stuffy nose   Soma [Carisoprodol]     Nausea, vomiting, swelling   Ultram [Tramadol Hcl]     vomiting   Tramadol Rash    Other  reaction(s): Other (See Comments) unknown     No medications prior to admission.    No results found for this or any previous visit (from the past 48 hour(s)). No results found.  Review of Systems  All other systems reviewed and are negative.  Height 5' 1.5" (1.562 m), weight 91.2 kg. Physical Exam  Patient is alert, oriented, no adenopathy, well-dressed, normal affect, normal respiratory effort. Examination patient is crepitation with range of motion of the right knee collaterals and cruciates are stable.  She is tender to palpation over the medial and lateral joint line as well as the patellofemoral joint.  She has an antalgic gait.  Review of the MRI scan shows medial and lateral meniscal tears as well as osteoarthritis of the knee.Heart RRR Lungs Clear Assessment/Plan Plan: Due to mechanical symptoms and MRI scan findings confirming meniscal tears patient states she would like to proceed with arthroscopic intervention.  Discussed risk and benefits of surgery discussed that with arthroscopy her pain from the meniscal tears should resolve however she would still  have pain from the osteoarthritis patient states she understands wished to proceed at this time.  Denise Ferguson Denise Peraza, PA 07/16/2021, 5:57 AM

## 2021-07-16 NOTE — Op Note (Signed)
07/16/2021  9:30 AM  PATIENT:  Denise Ferguson    PRE-OPERATIVE DIAGNOSIS:  Medial and Lateral Meniscal Tear Right Knee  POST-OPERATIVE DIAGNOSIS:  Same  PROCEDURE:  ARTHROSCOPIC DEBRIDEMENT RIGHT KNEE, PARTIAL MEDIAL AND LATERAL MENISECTOMIES, KNEE ARTHROSCOPY WITH LATERAL MENISECTOMY  SURGEON:  [name], MD  PHYSICIAN ASSISTANT:None ANESTHESIA:   General  PREOPERATIVE INDICATIONS:  Meyer Trejos is a  70 y.o. female with a diagnosis of Medial and Lateral Meniscal Tear Right Knee who failed conservative measures and elected for surgical management.    The risks benefits and alternatives were discussed with the patient preoperatively including but not limited to the risks of infection, bleeding, nerve injury, cardiopulmonary complications, the need for revision surgery, among others, and the patient was willing to proceed.  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  OPERATIVE PROCEDURE: Patient was brought the operating room after undergoing regional block she is underwent a general anesthetic.  After adequate levels anesthesia were obtained patient's right lower extremity was prepped using DuraPrep draped into a sterile field a timeout was called.  The scope was inserted to the anterior lateral portal and anterior medial working portals established.  Visualization patient had significant synovitis this was resected and electrocardio was used for hemostasis.  In a valgus stress patient had a large osteochondral defect of the medial femoral condyle medial tibial plateau and degenerative changes of the posterior horn the medial meniscus.  This was debrided with the shaver and the edges smooth.  Examination notch showed an intact ACL and PCL.  Examination lateral  joint line figure-of-four position showed large degenerative tear of the lateral meniscus this was debrided back to healthy viable margin there is also full-thickness cartilage loss of the lateral femoral condyle and lateral tibial plateau.  The further abrasion chondroplasty was performed to smooth the edges.  Examination of the patellofemoral joint with the knee extended showed a large plica this was excised.  There were no loose bodies in the medial lateral gutter survey of all compartments showed to be no further loose bodies instruments were removed the portals were closed using 2-0 nylon the incision was injected with 20 cc of quarter percent Marcaine plain a sterile dressing was applied patient was extubated taken the PACU in stable condition   DISCHARGE PLANNING:  Antibiotic duration: Preoperative antibiotics  Weightbearing: Weightbearing as tolerated  Pain medication: Prescription for Percocet  Dressing care/ Wound VAC: Change dressing in 2 days  Ambulatory devices: Crutches  Discharge to: Home.  Follow-up: In the office 1 week post operative.

## 2021-07-16 NOTE — Anesthesia Procedure Notes (Signed)
Procedure Name: LMA Insertion Date/Time: 07/16/2021 8:55 AM Performed by: Verita Lamb, CRNA Pre-anesthesia Checklist: Patient identified, Emergency Drugs available, Suction available and Patient being monitored Patient Re-evaluated:Patient Re-evaluated prior to induction Oxygen Delivery Method: Circle system utilized Preoxygenation: Pre-oxygenation with 100% oxygen Induction Type: IV induction Ventilation: Mask ventilation without difficulty LMA: LMA inserted LMA Size: 4.0 Number of attempts: 1 Airway Equipment and Method: Bite block Placement Confirmation: positive ETCO2 Tube secured with: Tape Dental Injury: Teeth and Oropharynx as per pre-operative assessment

## 2021-07-16 NOTE — Anesthesia Postprocedure Evaluation (Signed)
Anesthesia Post Note  Patient: Denise Ferguson  Procedure(s) Performed: ARTHROSCOPIC DEBRIDEMENT RIGHT KNEE, PARTIAL MEDIAL AND LATERAL MENISECTOMIES (Right: Knee) KNEE ARTHROSCOPY WITH LATERAL MENISECTOMY (Knee)     Patient location during evaluation: PACU Anesthesia Type: General Level of consciousness: awake and alert Pain management: pain level controlled Vital Signs Assessment: post-procedure vital signs reviewed and stable Respiratory status: spontaneous breathing, nonlabored ventilation, respiratory function stable and patient connected to nasal cannula oxygen Cardiovascular status: blood pressure returned to baseline and stable Postop Assessment: no apparent nausea or vomiting Anesthetic complications: no   No notable events documented.  Last Vitals:  Vitals:   07/16/21 1015 07/16/21 1029  BP: 128/75 (!) 121/43  Pulse: 77 77  Resp: 11 16  Temp:  36.5 C  SpO2: 94% 95%    Last Pain:  Vitals:   07/16/21 1029  TempSrc:   PainSc: 0-No pain                 Belenda Cruise P Clatie Kessen

## 2021-07-16 NOTE — Interval H&P Note (Signed)
History and Physical Interval Note:  07/16/2021 7:25 AM  Denise Ferguson  has presented today for surgery, with the diagnosis of Medial and Lateral Meniscal Tear Right Knee.  The various methods of treatment have been discussed with the patient and family. After consideration of risks, benefits and other options for treatment, the patient has consented to  Procedure(s): ARTHROSCOPIC DEBRIDEMENT RIGHT KNEE (Right) as a surgical intervention.  The patient's history has been reviewed, patient examined, no change in status, stable for surgery.  I have reviewed the patient's chart and labs.  Questions were answered to the patient's satisfaction.     [name]

## 2021-07-18 ENCOUNTER — Encounter (HOSPITAL_BASED_OUTPATIENT_CLINIC_OR_DEPARTMENT_OTHER): Payer: Self-pay | Admitting: Orthopedic Surgery

## 2021-07-23 ENCOUNTER — Encounter: Payer: Self-pay | Admitting: Orthopedic Surgery

## 2021-07-23 ENCOUNTER — Ambulatory Visit (INDEPENDENT_AMBULATORY_CARE_PROVIDER_SITE_OTHER): Payer: Medicare HMO | Admitting: Physician Assistant

## 2021-07-23 ENCOUNTER — Encounter: Payer: Medicare HMO | Admitting: Family

## 2021-07-23 DIAGNOSIS — M23203 Derangement of unspecified medial meniscus due to old tear or injury, right knee: Secondary | ICD-10-CM

## 2021-07-23 NOTE — Progress Notes (Signed)
Office Visit Note   Patient: Denise Ferguson           Date of Birth: Dec 06, 1950           MRN: [id number] Visit Date: 07/23/2021              Requested by: [name], NP [place],  [place] [address] PCP: [name], NP  Chief Complaint  Patient presents with   Right Knee - Routine Post Op      HPI: Patient presents today 1 week status post right knee arthroscopy with debridement of meniscus tear and chondroplasty.  She denies fever, chills, or calf pain in the belly of her calf.  She does have some tenderness in the back of her knee and slightly to the lateral aspect of her proximal calf.  Assessment & Plan: Visit Diagnoses: No diagnosis found.  Plan: She will continue to work on gentle range of motion ice and elevate.  If any increasing redness any increasing Pain she will contact us immediately.  Follow-up for stitch removal in 1 week  Follow-Up Instructions: No follow-ups on file.   Ortho Exam  Patient is alert, oriented, no adenopathy, well-dressed, normal affect, normal respiratory effort. Examination of her leg she has some bruising about her knee.  Surgical sutures in place mild to moderate soft tissue swelling but no cellulitis.  She has compressible calf without any pain.  She has a negative Homans' sign.  She does have some tenderness behind the knee which radiates slightly laterally.  She is very comfortable with passive dorsiflexion imaging: No results found. No images are attached to the encounter.  Labs: No results found for: HGBA1C, ESRSEDRATE, CRP, LABURIC, REPTSTATUS, GRAMSTAIN, CULT, LABORGA   No results found for: ALBUMIN, PREALBUMIN, CBC  No results found for: MG No results found for: VD25OH  No results found for: PREALBUMIN CBC EXTENDED Latest Ref Rng & Units 09/23/2011  WBC 4.5 - 10.5 K/uL 4.1(L)  RBC 3.87 - 5.11 Mil/uL 5.07  HGB 12.0 - 15.0 g/dL 11.8(L)  HCT 36.0 - 46.0 % 36.6  PLT 150.0 - 400.0 K/uL 237.0   NEUTROABS 1.4 - 7.7 K/uL 1.8  LYMPHSABS 0.7 - 4.0 K/uL 1.9     There is no height or weight on file to calculate BMI.  Orders:  No orders of the defined types were placed in this encounter.  No orders of the defined types were placed in this encounter.    Procedures: No procedures performed  Clinical Data: No additional findings.  ROS:  All other systems negative, except as noted in the HPI. Review of Systems  Objective: Vital Signs: There were no vitals taken for this visit.  Specialty Comments:  No specialty comments available.  PMFS History: Patient Active Problem List   Diagnosis Date Noted   History of meniscal tear    History of colon polyps 09/23/2011   GERD (gastroesophageal reflux disease) 09/23/2011   Grave's disease 09/23/2011   HTN (hypertension) 09/23/2011   Migraines 09/23/2011   Anemia 09/23/2011   Past Medical History:  Diagnosis Date   Allergy    Anemia    Arthritis    knees   Diabetes mellitus without complication ([place])    " Pre" DM- on Metformin    FHx: migraine headaches    GERD (gastroesophageal reflux disease)    Grave's disease    History of colon polyps    Hypertension    Hyperthyroidism  IBS (irritable bowel syndrome)    PONV (postoperative nausea and vomiting)     Family History  Problem Relation Age of Onset   Hypertension Mother    Diabetes Father    Hypertension Father    Lung cancer Other    Diabetes Brother    Alcohol abuse Other        uncle   Cancer Other        uncle   Colon cancer Neg Hx    Colon polyps Neg Hx    Esophageal cancer Neg Hx    Rectal cancer Neg Hx    Stomach cancer Neg Hx     Past Surgical History:  Procedure Laterality Date   ABDOMINAL HYSTERECTOMY     BREAST BIOPSY     CARPAL TUNNEL RELEASE  2000   bilateral   COLONOSCOPY     KNEE ARTHROSCOPY WITH LATERAL MENISECTOMY  07/16/2021   Procedure: KNEE ARTHROSCOPY WITH LATERAL MENISECTOMY;  Surgeon: [name], MD;  Location: [facility];  Service: Orthopedics;;   KNEE ARTHROSCOPY WITH MEDIAL MENISECTOMY Right 07/16/2021   Procedure: ARTHROSCOPIC DEBRIDEMENT RIGHT KNEE, PARTIAL MEDIAL AND LATERAL MENISECTOMIES;  Surgeon: [name], MD;  Location: [facility];  Service: Orthopedics;  Laterality: Right;   POLYPECTOMY     thyroid ablation     x2   TONSILLECTOMY     TUBAL LIGATION  1980   Social History   Occupational History   Occupation: retired Quarry manager  Tobacco Use   Smoking status: Never   Smokeless tobacco: Never  Substance and Sexual Activity   Alcohol use: No    Alcohol/week: 0.0 standard drinks   Drug use: No   Sexual activity: Not Currently    Birth control/protection: Surgical

## 2021-07-31 ENCOUNTER — Ambulatory Visit (INDEPENDENT_AMBULATORY_CARE_PROVIDER_SITE_OTHER): Payer: Medicare HMO | Admitting: Family

## 2021-07-31 ENCOUNTER — Encounter: Payer: Self-pay | Admitting: Family

## 2021-07-31 DIAGNOSIS — Z87828 Personal history of other (healed) physical injury and trauma: Secondary | ICD-10-CM

## 2021-07-31 NOTE — Progress Notes (Signed)
Post-Op Visit Note   Patient: Denise Ferguson           Date of Birth: 24-Jan-1951           MRN: [id number] Visit Date: 07/31/2021 PCP: [name], NP  Chief Complaint:  Chief Complaint  Patient presents with   Right Knee - Follow-up    HPI:  HPI The patient is a 70 year old woman who presents today postoperatively after arthroscopy with debridement of the right knee.  She continues to have pain she feels about the same as she did prior surgery.  She is having some swelling that waxes and wanes some aching in her calf Ortho Exam On examination of the right knee the surgical portals are well-healed sutures are in place there is no surrounding erythema no drainage no sign of infection.  The calf is nontender no palpable cords no edema negative Homans  Visit Diagnoses:  1. History of meniscal tear     Plan: Sutures harvested today she will continue to advance her weightbearing as she tolerates disc reassurance provided she will follow-up once more in 4 weeks  Follow-Up Instructions: Return in about 4 weeks (around 08/28/2021).   Imaging: No results found.  Orders:  No orders of the defined types were placed in this encounter.  No orders of the defined types were placed in this encounter.    PMFS History: Patient Active Problem List   Diagnosis Date Noted   History of meniscal tear    History of colon polyps 09/23/2011   GERD (gastroesophageal reflux disease) 09/23/2011   Grave's disease 09/23/2011   HTN (hypertension) 09/23/2011   Migraines 09/23/2011   Anemia 09/23/2011   Past Medical History:  Diagnosis Date   Allergy    Anemia    Arthritis    knees   Diabetes mellitus without complication ([place])    " Pre" DM- on Metformin    FHx: migraine headaches    GERD (gastroesophageal reflux disease)    Grave's disease    History of colon polyps    Hypertension    Hyperthyroidism    IBS (irritable bowel syndrome)    PONV (postoperative nausea and vomiting)      Family History  Problem Relation Age of Onset   Hypertension Mother    Diabetes Father    Hypertension Father    Lung cancer Other    Diabetes Brother    Alcohol abuse Other        uncle   Cancer Other        uncle   Colon cancer Neg Hx    Colon polyps Neg Hx    Esophageal cancer Neg Hx    Rectal cancer Neg Hx    Stomach cancer Neg Hx     Past Surgical History:  Procedure Laterality Date   ABDOMINAL HYSTERECTOMY     BREAST BIOPSY     CARPAL TUNNEL RELEASE  2000   bilateral   COLONOSCOPY     KNEE ARTHROSCOPY WITH LATERAL MENISECTOMY  07/16/2021   Procedure: KNEE ARTHROSCOPY WITH LATERAL MENISECTOMY;  Surgeon: [name], MD;  Location: [facility];  Service: Orthopedics;;   KNEE ARTHROSCOPY WITH MEDIAL MENISECTOMY Right 07/16/2021   Procedure: ARTHROSCOPIC DEBRIDEMENT RIGHT KNEE, PARTIAL MEDIAL AND LATERAL MENISECTOMIES;  Surgeon: [name], MD;  Location: [facility];  Service: Orthopedics;  Laterality: Right;   POLYPECTOMY     thyroid ablation     x2   TONSILLECTOMY  TUBAL LIGATION  1980   Social History   Occupational History   Occupation: retired Quarry manager  Tobacco Use   Smoking status: Never   Smokeless tobacco: Never  Substance and Sexual Activity   Alcohol use: No    Alcohol/week: 0.0 standard drinks   Drug use: No   Sexual activity: Not Currently    Birth control/protection: Surgical

## 2021-08-21 ENCOUNTER — Ambulatory Visit (INDEPENDENT_AMBULATORY_CARE_PROVIDER_SITE_OTHER): Payer: Medicare HMO | Admitting: Physician Assistant

## 2021-08-21 DIAGNOSIS — M25561 Pain in right knee: Secondary | ICD-10-CM

## 2021-08-21 MED ORDER — LIDOCAINE HCL 1 % IJ SOLN
5.0000 mL | INTRAMUSCULAR | Status: AC | PRN
  Administered 2021-08-21: 5 mL

## 2021-08-21 NOTE — Progress Notes (Signed)
Office Visit Note   Patient: Denise Ferguson           Date of Birth: October 31, 1951           MRN: [id number] Visit Date: 08/21/2021              Requested by: [name], NP [place],  [place] [address] PCP: [name], NP  Chief Complaint  Patient presents with   Right Knee - Pain      HPI: Patient is status post right knee arthroscopy about 5 weeks ago.  So far she has not seen significant changes.  She does use Voltaren and Voltaren gel.  Voltaren gel is helpful but does not last long.  She has especially painful start up when she is sitting and gets burning around her knee that goes down her leg a little bit.  She denies any pain going from her back or down her entire leg.  When she gets going it feels much better.  Assessment & Plan: Visit Diagnoses: No diagnosis found.  Plan: I reviewed the results of the arthroscopy.  She did have meniscus tears but also has quite a bit of arthritis.  We talked about doing physical therapy which she has not found helpful in the past.  We will go forward with an injection today.  She will follow-up with Dr. Sharol Given in 4 weeks.  We discussed knee replacement surgery and if she is no better she will discuss this with Dr. Sharol Given at her next visit  Follow-Up Instructions: No follow-ups on file.   Ortho Exam  Patient is alert, oriented, no adenopathy, well-dressed, normal affect, normal respiratory effort. Examination of her right knee well-healed surgical portals.  No swelling no erythema no effusion no cellulitis or signs of infection.  Well-healed surgical portals.  She has pain over the medial and lateral joint line  Imaging: No results found. No images are attached to the encounter.  Labs: No results found for: HGBA1C, ESRSEDRATE, CRP, LABURIC, REPTSTATUS, GRAMSTAIN, CULT, LABORGA   No results found for: ALBUMIN, PREALBUMIN, CBC  No results found for: MG No results found for: VD25OH  No results found for:  PREALBUMIN CBC EXTENDED Latest Ref Rng & Units 09/23/2011  WBC 4.5 - 10.5 K/uL 4.1(L)  RBC 3.87 - 5.11 Mil/uL 5.07  HGB 12.0 - 15.0 g/dL 11.8(L)  HCT 36.0 - 46.0 % 36.6  PLT 150.0 - 400.0 K/uL 237.0  NEUTROABS 1.4 - 7.7 K/uL 1.8  LYMPHSABS 0.7 - 4.0 K/uL 1.9     There is no height or weight on file to calculate BMI.  Orders:  No orders of the defined types were placed in this encounter.  No orders of the defined types were placed in this encounter.    Procedures: Large Joint Inj on 08/21/2021 1:36 PM Indications: pain and diagnostic evaluation Details: 22 G 1.5 in needle, anteromedial approach  Arthrogram: No  Medications: 5 mL lidocaine 1 % Outcome: tolerated well, no immediate complications Consent was given by the patient.     Clinical Data: No additional findings.  ROS:  All other systems negative, except as noted in the HPI. Review of Systems  Objective: Vital Signs: There were no vitals taken for this visit.  Specialty Comments:  No specialty comments available.  PMFS History: Patient Active Problem List   Diagnosis Date Noted   History of meniscal tear    History of colon polyps 09/23/2011   GERD (gastroesophageal  reflux disease) 09/23/2011   Grave's disease 09/23/2011   HTN (hypertension) 09/23/2011   Migraines 09/23/2011   Anemia 09/23/2011   Past Medical History:  Diagnosis Date   Allergy    Anemia    Arthritis    knees   Diabetes mellitus without complication ([place])    " Pre" DM- on Metformin    FHx: migraine headaches    GERD (gastroesophageal reflux disease)    Grave's disease    History of colon polyps    Hypertension    Hyperthyroidism    IBS (irritable bowel syndrome)    PONV (postoperative nausea and vomiting)     Family History  Problem Relation Age of Onset   Hypertension Mother    Diabetes Father    Hypertension Father    Lung cancer Other    Diabetes Brother    Alcohol abuse Other        uncle   Cancer Other         uncle   Colon cancer Neg Hx    Colon polyps Neg Hx    Esophageal cancer Neg Hx    Rectal cancer Neg Hx    Stomach cancer Neg Hx     Past Surgical History:  Procedure Laterality Date   ABDOMINAL HYSTERECTOMY     BREAST BIOPSY     CARPAL TUNNEL RELEASE  2000   bilateral   COLONOSCOPY     KNEE ARTHROSCOPY WITH LATERAL MENISECTOMY  07/16/2021   Procedure: KNEE ARTHROSCOPY WITH LATERAL MENISECTOMY;  Surgeon: [name], MD;  Location: [facility];  Service: Orthopedics;;   KNEE ARTHROSCOPY WITH MEDIAL MENISECTOMY Right 07/16/2021   Procedure: ARTHROSCOPIC DEBRIDEMENT RIGHT KNEE, PARTIAL MEDIAL AND LATERAL MENISECTOMIES;  Surgeon: [name], MD;  Location: [facility];  Service: Orthopedics;  Laterality: Right;   POLYPECTOMY     thyroid ablation     x2   TONSILLECTOMY     TUBAL LIGATION  1980   Social History   Occupational History   Occupation: retired Quarry manager  Tobacco Use   Smoking status: Never   Smokeless tobacco: Never  Substance and Sexual Activity   Alcohol use: No    Alcohol/week: 0.0 standard drinks   Drug use: No   Sexual activity: Not Currently    Birth control/protection: Surgical

## 2021-09-18 ENCOUNTER — Ambulatory Visit: Payer: Medicare HMO | Admitting: Physician Assistant

## 2021-09-19 ENCOUNTER — Ambulatory Visit (INDEPENDENT_AMBULATORY_CARE_PROVIDER_SITE_OTHER): Payer: Medicare HMO | Admitting: Orthopedic Surgery

## 2021-09-19 DIAGNOSIS — M23203 Derangement of unspecified medial meniscus due to old tear or injury, right knee: Secondary | ICD-10-CM

## 2021-10-01 ENCOUNTER — Encounter: Payer: Self-pay | Admitting: Orthopedic Surgery

## 2021-10-01 NOTE — Progress Notes (Signed)
Office Visit Note   Patient: Denise Ferguson           Date of Birth: Dec 01, 1950           MRN: [id number] Visit Date: 09/19/2021              Requested by: [name], NP [place],  [place] [address] PCP: [name], NP  Chief Complaint  Patient presents with   Right Knee - Follow-up    S/p injection 08/21/21      HPI: Patient is a 70 year old woman who is status post injection right knee.  Patient states she is doing well.  Assessment & Plan: Visit Diagnoses:  1. 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  Follow-Up Instructions: Return if symptoms worsen or fail to improve.   Ortho Exam  Patient is alert, oriented, no adenopathy, well-dressed, normal affect, normal respiratory effort. Examination there is no redness no swelling there is small amount of pain to palpation over the medial joint line collaterals are cruciates are stable  Imaging: No results found. No images are attached to the encounter.  Labs: No results found for: HGBA1C, ESRSEDRATE, CRP, LABURIC, REPTSTATUS, GRAMSTAIN, CULT, LABORGA   No results found for: ALBUMIN, PREALBUMIN, CBC  No results found for: MG No results found for: VD25OH  No results found for: PREALBUMIN CBC EXTENDED Latest Ref Rng & Units 09/23/2011  WBC 4.5 - 10.5 K/uL 4.1(L)  RBC 3.87 - 5.11 Mil/uL 5.07  HGB 12.0 - 15.0 g/dL 11.8(L)  HCT 36.0 - 46.0 % 36.6  PLT 150.0 - 400.0 K/uL 237.0  NEUTROABS 1.4 - 7.7 K/uL 1.8  LYMPHSABS 0.7 - 4.0 K/uL 1.9     There is no height or weight on file to calculate BMI.  Orders:  No orders of the defined types were placed in this encounter.  No orders of the defined types were placed in this encounter.    Procedures: No procedures performed  Clinical Data: No additional findings.  ROS:  All other systems negative, except as noted in the HPI. Review of Systems  Objective: Vital  Signs: There were no vitals taken for this visit.  Specialty Comments:  No specialty comments available.  PMFS History: Patient Active Problem List   Diagnosis Date Noted   History of meniscal tear    History of colon polyps 09/23/2011   GERD (gastroesophageal reflux disease) 09/23/2011   Grave's disease 09/23/2011   HTN (hypertension) 09/23/2011   Migraines 09/23/2011   Anemia 09/23/2011   Past Medical History:  Diagnosis Date   Allergy    Anemia    Arthritis    knees   Diabetes mellitus without complication ([place])    " Pre" DM- on Metformin    FHx: migraine headaches    GERD (gastroesophageal reflux disease)    Grave's disease    History of colon polyps    Hypertension    Hyperthyroidism    IBS (irritable bowel syndrome)    PONV (postoperative nausea and vomiting)     Family History  Problem Relation Age of Onset   Hypertension Mother    Diabetes Father    Hypertension Father    Lung cancer Other    Diabetes Brother    Alcohol abuse Other        uncle   Cancer Other        uncle   Colon cancer  Neg Hx    Colon polyps Neg Hx    Esophageal cancer Neg Hx    Rectal cancer Neg Hx    Stomach cancer Neg Hx     Past Surgical History:  Procedure Laterality Date   ABDOMINAL HYSTERECTOMY     BREAST BIOPSY     CARPAL TUNNEL RELEASE  2000   bilateral   COLONOSCOPY     KNEE ARTHROSCOPY WITH LATERAL MENISECTOMY  07/16/2021   Procedure: KNEE ARTHROSCOPY WITH LATERAL MENISECTOMY;  Surgeon: [name], MD;  Location: [facility];  Service: Orthopedics;;   KNEE ARTHROSCOPY WITH MEDIAL MENISECTOMY Right 07/16/2021   Procedure: ARTHROSCOPIC DEBRIDEMENT RIGHT KNEE, PARTIAL MEDIAL AND LATERAL MENISECTOMIES;  Surgeon: [name], MD;  Location: [facility];  Service: Orthopedics;  Laterality: Right;   POLYPECTOMY     thyroid ablation     x2   TONSILLECTOMY     TUBAL LIGATION  1980   Social History   Occupational History   Occupation:  retired Quarry manager  Tobacco Use   Smoking status: Never   Smokeless tobacco: Never  Substance and Sexual Activity   Alcohol use: No    Alcohol/week: 0.0 standard drinks   Drug use: No   Sexual activity: Not Currently    Birth control/protection: Surgical

## 2022-04-10 ENCOUNTER — Encounter: Payer: Self-pay | Admitting: Orthopedic Surgery

## 2022-04-10 ENCOUNTER — Ambulatory Visit: Payer: Medicare HMO | Admitting: Orthopedic Surgery

## 2022-04-10 ENCOUNTER — Ambulatory Visit (INDEPENDENT_AMBULATORY_CARE_PROVIDER_SITE_OTHER): Payer: Medicare HMO

## 2022-04-10 DIAGNOSIS — G8929 Other chronic pain: Secondary | ICD-10-CM | POA: Diagnosis not present

## 2022-04-10 DIAGNOSIS — M25561 Pain in right knee: Secondary | ICD-10-CM

## 2022-04-10 MED ORDER — METHYLPREDNISOLONE ACETATE 40 MG/ML IJ SUSP
40.0000 mg | INTRAMUSCULAR | Status: AC | PRN
  Administered 2022-04-10: 40 mg via INTRA_ARTICULAR

## 2022-04-10 MED ORDER — LIDOCAINE HCL (PF) 1 % IJ SOLN
5.0000 mL | INTRAMUSCULAR | Status: AC | PRN
  Administered 2022-04-10: 5 mL

## 2022-04-10 NOTE — Progress Notes (Signed)
Office Visit Note   Patient: Denise Ferguson           Date of Birth: 03-28-1951           MRN: [id number] Visit Date: 04/10/2022              Requested by: [name], NP [place],  [place] [address] PCP: [name], NP  Chief Complaint  Patient presents with   Right Knee - Pain      HPI: Patient is a 71 year old woman with right knee pain primarily over the medial joint line has difficulty sleeping and decreased activity secondary to pain decreased range of motion.  Patient was treated for meniscal tear last year and also received a steroid injection last year.  Assessment & Plan: Visit Diagnoses:  1. Chronic pain of right knee     Plan: Patient underwent a repeat injection of the right knee.  Follow-Up Instructions: Return if symptoms worsen or fail to improve.   Ortho Exam  Patient is alert, oriented, no adenopathy, well-dressed, normal affect, normal respiratory effort. Examination patient has crepitation with range of motion of the right knee collaterals and cruciates are stable she is tender to palpation over the medial joint line.  No palpable mass in the popliteal fossa.  Pain with walking pain going up and down stairs.  Imaging: XR Knee 1-2 Views Right  Result Date: 04/10/2022 2 view radiographs of the right knee show subcondylar sclerosis and cystic changes with periarticular bony spurs no varus or valgus malalignment  No images are attached to the encounter.  Labs: No results found for: HGBA1C, ESRSEDRATE, CRP, LABURIC, REPTSTATUS, GRAMSTAIN, CULT, LABORGA   No results found for: ALBUMIN, PREALBUMIN, CBC  No results found for: MG No results found for: VD25OH  No results found for: PREALBUMIN    Latest Ref Rng & Units 09/23/2011    9:38 AM  CBC EXTENDED  WBC 4.5 - 10.5 K/uL 4.1    RBC 3.87 - 5.11 Mil/uL 5.07    Hemoglobin 12.0 - 15.0 g/dL 11.8    HCT 36.0 - 46.0 % 36.6    Platelets 150.0 - 400.0 K/uL 237.0     NEUT# 1.4 - 7.7 K/uL 1.8    Lymph# 0.7 - 4.0 K/uL 1.9       There is no height or weight on file to calculate BMI.  Orders:  Orders Placed This Encounter  Procedures   XR Knee 1-2 Views Right   No orders of the defined types were placed in this encounter.    Procedures: Large Joint Inj: R knee on 04/10/2022 5:39 PM Indications: pain and diagnostic evaluation Details: 22 G 1.5 in needle, anteromedial approach  Arthrogram: No  Medications: 5 mL lidocaine (PF) 1 %; 40 mg methylPREDNISolone acetate 40 MG/ML Outcome: tolerated well, no immediate complications Procedure, treatment alternatives, risks and benefits explained, specific risks discussed. Consent was given by the patient. Immediately prior to procedure a time out was called to verify the correct patient, procedure, equipment, support staff and site/side marked as required. Patient was prepped and draped in the usual sterile fashion.     Clinical Data: No additional findings.  ROS:  All other systems negative, except as noted in the HPI. Review of Systems  Objective: Vital Signs: There were no vitals taken for this visit.  Specialty Comments:  No specialty comments available.  PMFS History: Patient Active Problem List   Diagnosis Date Noted  History of meniscal tear    History of colon polyps 09/23/2011   GERD (gastroesophageal reflux disease) 09/23/2011   Grave's disease 09/23/2011   HTN (hypertension) 09/23/2011   Migraines 09/23/2011   Anemia 09/23/2011   Past Medical History:  Diagnosis Date   Allergy    Anemia    Arthritis    knees   Diabetes mellitus without complication ([place])    " Pre" DM- on Metformin    FHx: migraine headaches    GERD (gastroesophageal reflux disease)    Grave's disease    History of colon polyps    Hypertension    Hyperthyroidism    IBS (irritable bowel syndrome)    PONV (postoperative nausea and vomiting)     Family History  Problem Relation Age of Onset    Hypertension Mother    Diabetes Father    Hypertension Father    Lung cancer Other    Diabetes Brother    Alcohol abuse Other        uncle   Cancer Other        uncle   Colon cancer Neg Hx    Colon polyps Neg Hx    Esophageal cancer Neg Hx    Rectal cancer Neg Hx    Stomach cancer Neg Hx     Past Surgical History:  Procedure Laterality Date   ABDOMINAL HYSTERECTOMY     BREAST BIOPSY     CARPAL TUNNEL RELEASE  2000   bilateral   COLONOSCOPY     KNEE ARTHROSCOPY WITH LATERAL MENISECTOMY  07/16/2021   Procedure: KNEE ARTHROSCOPY WITH LATERAL MENISECTOMY;  Surgeon: [name], MD;  Location: [facility];  Service: Orthopedics;;   KNEE ARTHROSCOPY WITH MEDIAL MENISECTOMY Right 07/16/2021   Procedure: ARTHROSCOPIC DEBRIDEMENT RIGHT KNEE, PARTIAL MEDIAL AND LATERAL MENISECTOMIES;  Surgeon: [name], MD;  Location: [facility];  Service: Orthopedics;  Laterality: Right;   POLYPECTOMY     thyroid ablation     x2   TONSILLECTOMY     TUBAL LIGATION  1980   Social History   Occupational History   Occupation: retired Quarry manager  Tobacco Use   Smoking status: Never   Smokeless tobacco: Never  Substance and Sexual Activity   Alcohol use: No    Alcohol/week: 0.0 standard drinks   Drug use: No   Sexual activity: Not Currently    Birth control/protection: Surgical

## 2022-10-30 ENCOUNTER — Encounter: Payer: Self-pay | Admitting: Orthopedic Surgery

## 2022-10-30 ENCOUNTER — Ambulatory Visit (INDEPENDENT_AMBULATORY_CARE_PROVIDER_SITE_OTHER): Payer: Medicare HMO

## 2022-10-30 ENCOUNTER — Ambulatory Visit: Payer: Medicare HMO | Admitting: Orthopedic Surgery

## 2022-10-30 DIAGNOSIS — M25562 Pain in left knee: Secondary | ICD-10-CM

## 2022-10-30 DIAGNOSIS — M170 Bilateral primary osteoarthritis of knee: Secondary | ICD-10-CM | POA: Diagnosis not present

## 2022-10-30 DIAGNOSIS — G8929 Other chronic pain: Secondary | ICD-10-CM | POA: Diagnosis not present

## 2022-10-30 DIAGNOSIS — M25561 Pain in right knee: Secondary | ICD-10-CM | POA: Diagnosis not present

## 2022-10-30 NOTE — Progress Notes (Signed)
Office Visit Note   Patient: Denise Ferguson           Date of Birth: 1951/08/13           MRN: [id number] Visit Date: 10/30/2022              Requested by: [name], NP [place],  [place] [address] PCP: [name], NP  Chief Complaint  Patient presents with   Right Knee - Pain   Left Knee - Pain      HPI: Patient is a 71 year old woman with osteoarthritis both knees.  She is status post a right knee injection in May.  She has been having increasing pain over the left knee for the past several weeks.  She states she is almost fallen twice going downstairs.  Assessment & Plan: Visit Diagnoses:  1. Chronic pain of both knees   2. Bilateral primary osteoarthritis of knee     Plan: Both knees were injected she tolerated this well.  Follow-Up Instructions: Return if symptoms worsen or fail to improve.   Ortho Exam  Patient is alert, oriented, no adenopathy, well-dressed, normal affect, normal respiratory effort. Examination patient has an antalgic gait.  There is crepitation with range of motion of both knees.  She has tenderness to palpation of the medial and lateral joint line Clauser cruciates are stable.  Imaging: XR Knee 1-2 Views Left  Result Date: 10/30/2022 2 view radiographs of the left knee shows osteoarthritis with no varus or valgus deformity.  There are periarticular bony spurs and subchondral or sclerosis.  No images are attached to the encounter.  Labs: No results found for: "HGBA1C", "ESRSEDRATE", "CRP", "LABURIC", "REPTSTATUS", "GRAMSTAIN", "CULT", "LABORGA"   No results found for: "ALBUMIN", "PREALBUMIN", "CBC"  No results found for: "MG" No results found for: "VD25OH"  No results found for: "PREALBUMIN"    Latest Ref Rng & Units 09/23/2011    9:38 AM  CBC EXTENDED  WBC 4.5 - 10.5 K/uL 4.1   RBC 3.87 - 5.11 Mil/uL 5.07   Hemoglobin 12.0 - 15.0 g/dL 11.8   HCT 36.0 - 46.0 % 36.6   Platelets 150.0 - 400.0 K/uL  237.0   NEUT# 1.4 - 7.7 K/uL 1.8   Lymph# 0.7 - 4.0 K/uL 1.9      There is no height or weight on file to calculate BMI.  Orders:  Orders Placed This Encounter  Procedures   XR Knee 1-2 Views Left   No orders of the defined types were placed in this encounter.    Procedures: Large Joint Inj: bilateral knee on 10/30/2022 3:29 PM Indications: pain and diagnostic evaluation Details: 22 G 1.5 in needle, anteromedial approach  Arthrogram: No  Outcome: tolerated well, no immediate complications Procedure, treatment alternatives, risks and benefits explained, specific risks discussed. Consent was given by the patient. Immediately prior to procedure a time out was called to verify the correct patient, procedure, equipment, support staff and site/side marked as required. Patient was prepped and draped in the usual sterile fashion.      Clinical Data: No additional findings.  ROS:  All other systems negative, except as noted in the HPI. Review of Systems  Objective: Vital Signs: There were no vitals taken for this visit.  Specialty Comments:  No specialty comments available.  PMFS History: Patient Active Problem List   Diagnosis Date Noted   History of meniscal tear    History of colon polyps 09/23/2011  GERD (gastroesophageal reflux disease) 09/23/2011   Grave's disease 09/23/2011   HTN (hypertension) 09/23/2011   Migraines 09/23/2011   Anemia 09/23/2011   Past Medical History:  Diagnosis Date   Allergy    Anemia    Arthritis    knees   Diabetes mellitus without complication ([place])    " Pre" DM- on Metformin    FHx: migraine headaches    GERD (gastroesophageal reflux disease)    Grave's disease    History of colon polyps    Hypertension    Hyperthyroidism    IBS (irritable bowel syndrome)    PONV (postoperative nausea and vomiting)     Family History  Problem Relation Age of Onset   Hypertension Mother    Diabetes Father    Hypertension Father    Lung  cancer Other    Diabetes Brother    Alcohol abuse Other        uncle   Cancer Other        uncle   Colon cancer Neg Hx    Colon polyps Neg Hx    Esophageal cancer Neg Hx    Rectal cancer Neg Hx    Stomach cancer Neg Hx     Past Surgical History:  Procedure Laterality Date   ABDOMINAL HYSTERECTOMY     BREAST BIOPSY     CARPAL TUNNEL RELEASE  2000   bilateral   COLONOSCOPY     KNEE ARTHROSCOPY WITH LATERAL MENISECTOMY  07/16/2021   Procedure: KNEE ARTHROSCOPY WITH LATERAL MENISECTOMY;  Surgeon: [name], MD;  Location: [facility];  Service: Orthopedics;;   KNEE ARTHROSCOPY WITH MEDIAL MENISECTOMY Right 07/16/2021   Procedure: ARTHROSCOPIC DEBRIDEMENT RIGHT KNEE, PARTIAL MEDIAL AND LATERAL MENISECTOMIES;  Surgeon: [name], MD;  Location: [facility];  Service: Orthopedics;  Laterality: Right;   POLYPECTOMY     thyroid ablation     x2   TONSILLECTOMY     TUBAL LIGATION  1980   Social History   Occupational History   Occupation: retired Quarry manager  Tobacco Use   Smoking status: Never   Smokeless tobacco: Never  Substance and Sexual Activity   Alcohol use: No    Alcohol/week: 0.0 standard drinks of alcohol   Drug use: No   Sexual activity: Not Currently    Birth control/protection: Surgical

## 2022-12-29 ENCOUNTER — Encounter: Payer: Self-pay | Admitting: Cardiology

## 2022-12-29 ENCOUNTER — Ambulatory Visit: Payer: Medicare HMO | Admitting: Cardiology

## 2022-12-29 ENCOUNTER — Other Ambulatory Visit: Payer: Medicare HMO

## 2022-12-29 VITALS — BP 143/62 | HR 70 | Resp 96 | Ht 61.0 in | Wt 184.0 lb

## 2022-12-29 DIAGNOSIS — R000 Tachycardia, unspecified: Secondary | ICD-10-CM

## 2022-12-29 DIAGNOSIS — R072 Precordial pain: Secondary | ICD-10-CM

## 2022-12-29 DIAGNOSIS — R0609 Other forms of dyspnea: Secondary | ICD-10-CM

## 2022-12-29 DIAGNOSIS — R002 Palpitations: Secondary | ICD-10-CM

## 2022-12-29 NOTE — Progress Notes (Signed)
Patient referred by [name], NP for palpitations  Subjective:   Denise Ferguson, female    DOB: 1951-08-17, 72 y.o.   MRN: [id number]   Chief Complaint  Patient presents with   Chest Pain   Tachycardia   New Patient (Initial Visit)   Dizziness     HPI  72 y.o. African American female with hypertension, prediabetes, hypothyroidism s/p RAI ablationX2 for Grave's disease, referred for palpitations by Dr. Chalmers Cater  Patient is here with her daughter today. She has had episodes of sudden onset palpitations lasting for 15-20 min, sometimes longer, associated with shortness of breath. She also has separate episodes of chest heaviness that happens usually when she lays down at night. She drinks decaffeinated tea, does not drink alcohol.    Past Medical History:  Diagnosis Date   Allergy    Anemia    Arthritis    knees   Diabetes mellitus without complication ([place])    " Pre" DM- on Metformin    FHx: migraine headaches    GERD (gastroesophageal reflux disease)    Grave's disease    History of colon polyps    Hypertension    Hyperthyroidism    IBS (irritable bowel syndrome)    PONV (postoperative nausea and vomiting)      Past Surgical History:  Procedure Laterality Date   ABDOMINAL HYSTERECTOMY     BREAST BIOPSY     CARPAL TUNNEL RELEASE  2000   bilateral   COLONOSCOPY     KNEE ARTHROSCOPY WITH LATERAL MENISECTOMY  07/16/2021   Procedure: KNEE ARTHROSCOPY WITH LATERAL MENISECTOMY;  Surgeon: [name], MD;  Location: [facility];  Service: Orthopedics;;   KNEE ARTHROSCOPY WITH MEDIAL MENISECTOMY Right 07/16/2021   Procedure: ARTHROSCOPIC DEBRIDEMENT RIGHT KNEE, PARTIAL MEDIAL AND LATERAL MENISECTOMIES;  Surgeon: [name], MD;  Location: [facility];  Service: Orthopedics;  Laterality: Right;   POLYPECTOMY     thyroid ablation     x2   TONSILLECTOMY     TUBAL LIGATION  1980     Social History   Tobacco Use  Smoking Status  Never  Smokeless Tobacco Never    Social History   Substance and Sexual Activity  Alcohol Use No   Alcohol/week: 0.0 standard drinks of alcohol     Family History  Problem Relation Age of Onset   Hypertension Mother    Diabetes Father    Hypertension Father    Diabetes Brother    Lung cancer Other    Alcohol abuse Other        uncle   Cancer Other        uncle   Colon cancer Neg Hx    Colon polyps Neg Hx    Esophageal cancer Neg Hx    Rectal cancer Neg Hx    Stomach cancer Neg Hx       Current Outpatient Medications:    Acetaminophen (TYLENOL EXTRA STRENGTH PO), Take by mouth as needed., Disp: , Rfl:    amLODipine (NORVASC) 10 MG tablet, Take 1 tablet by mouth daily., Disp: , Rfl:    estradiol (ESTRACE) 0.5 MG tablet, , Disp: , Rfl:    gabapentin (NEURONTIN) 300 MG capsule, 600 mg at bedtime., Disp: , Rfl:    levothyroxine (SYNTHROID, LEVOTHROID) 75 MCG tablet, Take 75 mcg by mouth every other day., Disp: , Rfl:    losartan-hydrochlorothiazide (HYZAAR) 100-25 MG per tablet, Take 1 tablet by mouth daily.  , Disp: ,  Rfl:    metFORMIN (GLUCOPHAGE-XR) 500 MG 24 hr tablet, , Disp: , Rfl:    omeprazole (PRILOSEC) 40 MG capsule, Take 40 mg by mouth., Disp: , Rfl:    Accu-Chek Softclix Lancets lancets, , Disp: , Rfl:    Blood Glucose Monitoring Suppl (ACCU-CHEK GUIDE) w/Device KIT, , Disp: , Rfl:    Cardiovascular and other pertinent studies:  Reviewed external labs and tests, independently interpreted  EKG 12/29/2022: Sinus rhythm 62 bpm Normal EKG   Recent labs: 09/05/2022: Glucose 91, BUN/Cr 14/0.92. EGFR 66. Na/K 139/3.8. Rest of the CMP normal H/H 11/36. MCV 69. Platelets 250 HbA1C 6.3% Chol 124, TG 75, HDL 55, LDL 54 TSH 0.8 normal  12/2021: HbA1C 6.0%    Review of Systems  Cardiovascular:  Positive for palpitations. Negative for chest pain, dyspnea on exertion, leg swelling and syncope.  Respiratory:  Positive for shortness of breath.           Vitals:   12/29/22 1459 12/29/22 1500  BP:    Pulse:    Resp: (!) 96   SpO2:  97%     Body mass index is 34.77 kg/m. Filed Weights   12/29/22 1446  Weight: 184 lb (83.5 kg)     Objective:   Physical Exam Vitals and nursing note reviewed.  Constitutional:      General: She is not in acute distress. Neck:     Vascular: No JVD.  Cardiovascular:     Rate and Rhythm: Normal rate and regular rhythm.     Heart sounds: Normal heart sounds. No murmur heard. Pulmonary:     Effort: Pulmonary effort is normal.     Breath sounds: Normal breath sounds. No wheezing or rales.            Visit diagnoses:   ICD-10-CM   1. Tachycardia  R00.0 EKG 12-Lead    2. Palpitations  R00.2 LONG TERM MONITOR (3-14 DAYS)    3. Exertional dyspnea  R06.09 PCV ECHOCARDIOGRAM COMPLETE    PCV MYOCARDIAL PERFUSION WITH LEXISCAN    4. Precordial pain  R07.2 PCV ECHOCARDIOGRAM COMPLETE    PCV MYOCARDIAL PERFUSION WITH LEXISCAN       Orders Placed This Encounter  Procedures   EKG 12-Lead     Medication changes this visit: Medications Discontinued During This Encounter  Medication Reason   meloxicam (MOBIC) 15 MG tablet Patient Preference   oxyCODONE-acetaminophen (PERCOCET/ROXICET) 5-325 MG tablet Patient Preference    No orders of the defined types were placed in this encounter.    Assessment & Recommendations:   72 y.o. African American female with hypertension, prediabetes, hypothyroidism s/p RAI ablationX2 for Grave's disease, referred for palpitations by Dr. Chalmers Cater  Symptoms are concerning for possible reentrant tachyarrhythmia. In addition, also concern for CAD given precordial pain, dyspnea. Recommend Lexiscan nuclear stress test (unable to exercise due to knee pain), echocardiogram, cardiac telemetry for 1 week. Dicussed vagal maneuvers.   Further recommendations after above testing.    Thank you for referring the patient to [place]. Please feel free to contact with any  questions.   Nigel Mormon, MD Pager: [phone] Office: [phone]

## 2023-01-06 ENCOUNTER — Ambulatory Visit: Payer: Medicare HMO

## 2023-01-06 DIAGNOSIS — R0609 Other forms of dyspnea: Secondary | ICD-10-CM

## 2023-01-06 DIAGNOSIS — R072 Precordial pain: Secondary | ICD-10-CM

## 2023-01-12 ENCOUNTER — Ambulatory Visit: Payer: Medicare HMO

## 2023-01-12 DIAGNOSIS — R0609 Other forms of dyspnea: Secondary | ICD-10-CM

## 2023-01-12 DIAGNOSIS — R072 Precordial pain: Secondary | ICD-10-CM

## 2023-01-27 NOTE — Progress Notes (Unsigned)
Patient referred by Denise Harvey, NP for palpitations  Subjective:   Denise Ferguson, female    DOB: [birth year], 72 y.o.   MRN: [id number]   No chief complaint on file.    HPI  72 y.o. African American female with hypertension, prediabetes, hypothyroidism s/p RAI ablationX2 for Grave's disease, referred for palpitations by Dr. Chalmers Cater  ***  Initial consultation visit 12/2022: Patient is here with her daughter today. She has had episodes of sudden onset palpitations lasting for 15-20 min, sometimes longer, associated with shortness of breath. She also has separate episodes of chest heaviness that happens usually when she lays down at night. She drinks decaffeinated tea, does not drink alcohol.     Current Outpatient Medications:    Accu-Chek Softclix Lancets lancets, , Disp: , Rfl:    Acetaminophen (TYLENOL EXTRA STRENGTH PO), Take by mouth as needed., Disp: , Rfl:    amLODipine (NORVASC) 10 MG tablet, Take 1 tablet by mouth daily., Disp: , Rfl:    Blood Glucose Monitoring Suppl (ACCU-CHEK GUIDE) w/Device KIT, , Disp: , Rfl:    estradiol (ESTRACE) 0.5 MG tablet, , Disp: , Rfl:    gabapentin (NEURONTIN) 300 MG capsule, 600 mg at bedtime., Disp: , Rfl:    levothyroxine (SYNTHROID, LEVOTHROID) 75 MCG tablet, Take 75 mcg by mouth every other day., Disp: , Rfl:    losartan-hydrochlorothiazide (HYZAAR) 100-25 MG per tablet, Take 1 tablet by mouth daily.  , Disp: , Rfl:    metFORMIN (GLUCOPHAGE-XR) 500 MG 24 hr tablet, , Disp: , Rfl:    omeprazole (PRILOSEC) 40 MG capsule, Take 40 mg by mouth., Disp: , Rfl:    Cardiovascular and other pertinent studies:  Reviewed external labs and tests, independently interpreted  Mobile cardiac telemetry 7 days 12/29/2022 - 01/05/2023: Dominant rhythm: Sinus. HR 51-147 bpm. Avg HR 76 bpm, in sinus rhythm 3 episodes of SVT, fastest and at 176 bpm for 1 min 9 secs. 1.6% isolated SVE, <1% couplet/triplets. 0 episodes of VT. <1% isolated VE,  couplets. No atrial fibrillation/atrial flutter/VT/high grade AV block, sinus pause >3sec noted. 3 patient triggered events, correlated with SVE, VE, SVT.    EKG 12/29/2022: Sinus rhythm 62 bpm Normal EKG   Recent labs: 09/05/2022: Glucose 91, BUN/Cr 14/0.92. EGFR 66. Na/K 139/3.8. Rest of the CMP normal H/H 11/36. MCV 69. Platelets 250 HbA1C 6.3% Chol 124, TG 75, HDL 55, LDL 54 TSH 0.8 normal  12/2021: HbA1C 6.0%    Review of Systems  Cardiovascular:  Positive for palpitations. Negative for chest pain, dyspnea on exertion, leg swelling and syncope.  Respiratory:  Positive for shortness of breath.          There were no vitals filed for this visit.    There is no height or weight on file to calculate BMI. There were no vitals filed for this visit.    Objective:   Physical Exam Vitals and nursing note reviewed.  Constitutional:      General: She is not in acute distress. Neck:     Vascular: No JVD.  Cardiovascular:     Rate and Rhythm: Normal rate and regular rhythm.     Heart sounds: Normal heart sounds. No murmur heard. Pulmonary:     Effort: Pulmonary effort is normal.     Breath sounds: Normal breath sounds. No wheezing or rales.            Visit diagnoses: No diagnosis found.    No orders of the defined types  were placed in this encounter.    Medication changes this visit: There are no discontinued medications.   No orders of the defined types were placed in this encounter.    Assessment & Recommendations:   72 y.o. African American female with hypertension, prediabetes, hypothyroidism s/p RAI ablationX2 for Grave's disease, referred for palpitations by Dr. Chalmers Cater  Symptoms are concerning for possible reentrant tachyarrhythmia. In addition, also concern for CAD given precordial pain, dyspnea. Recommend Lexiscan nuclear stress test (unable to exercise due to knee pain), echocardiogram, cardiac telemetry for 1 week. Dicussed vagal maneuvers.    Further recommendations after above testing.    Thank you for referring the patient to [place]. Please feel free to contact with any questions.   Nigel Mormon, MD Pager: [phone] Office: [phone]

## 2023-01-29 ENCOUNTER — Encounter: Payer: Self-pay | Admitting: Cardiology

## 2023-01-29 ENCOUNTER — Ambulatory Visit: Payer: Medicare HMO | Admitting: Cardiology

## 2023-01-29 VITALS — BP 144/75 | HR 92 | Resp 16 | Ht 61.0 in | Wt 192.0 lb

## 2023-01-29 DIAGNOSIS — I471 Supraventricular tachycardia, unspecified: Secondary | ICD-10-CM | POA: Insufficient documentation

## 2023-01-29 DIAGNOSIS — I10 Essential (primary) hypertension: Secondary | ICD-10-CM

## 2023-01-29 MED ORDER — METOPROLOL SUCCINATE ER 50 MG PO TB24: 50.0000 mg | ORAL_TABLET | Freq: Every day | ORAL | 3 refills | Status: DC

## 2023-03-24 ENCOUNTER — Other Ambulatory Visit: Payer: Self-pay

## 2023-03-24 DIAGNOSIS — I471 Supraventricular tachycardia, unspecified: Secondary | ICD-10-CM

## 2023-03-24 MED ORDER — METOPROLOL SUCCINATE ER 50 MG PO TB24: 50.0000 mg | ORAL_TABLET | Freq: Every day | ORAL | 3 refills | Status: DC

## 2023-04-19 IMAGING — MR MR KNEE*R* W/O CM
4 of 8 series · 18 of 40 positions shown · non-contrast
Comparison: X-ray 05/16/2021

CLINICAL DATA: Meniscal tear, untreated, new symptoms. Medial knee
pain and swelling. No known injury.

EXAM:
MRI OF THE RIGHT KNEE WITHOUT CONTRAST
TECHNIQUE: Multiplanar, multisequence MR imaging of the knee was performed. No
intravenous contrast was administered.

[Series 3: T2 fat-sat · axial · 4.0mm · 0.33mm/px · z∈[-87,+58]mm · 5 of 34 slices shown]
[im 1/34]
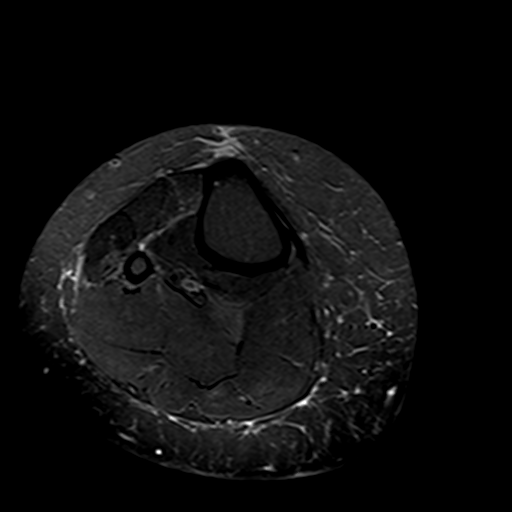
[im 9/34]
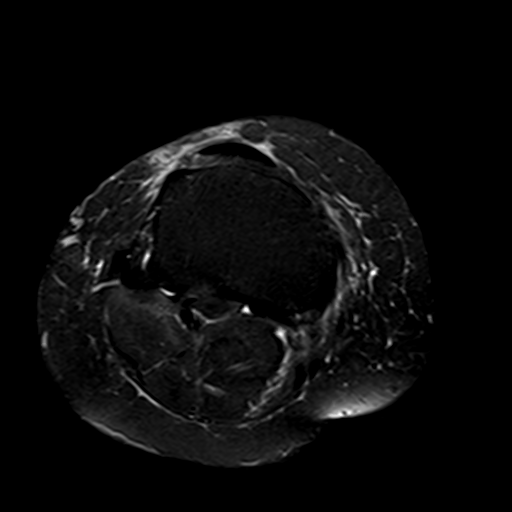
[im 17/34]
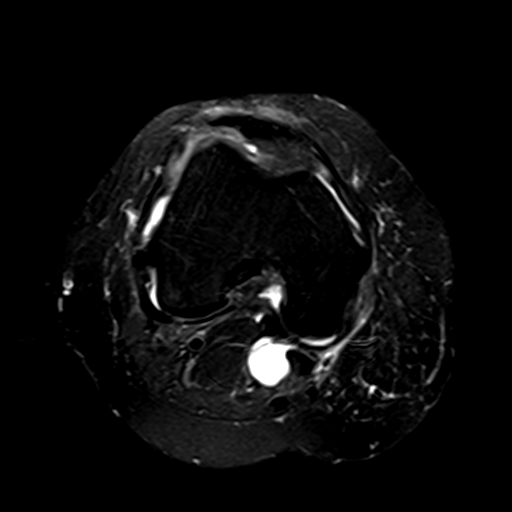
[im 25/34]
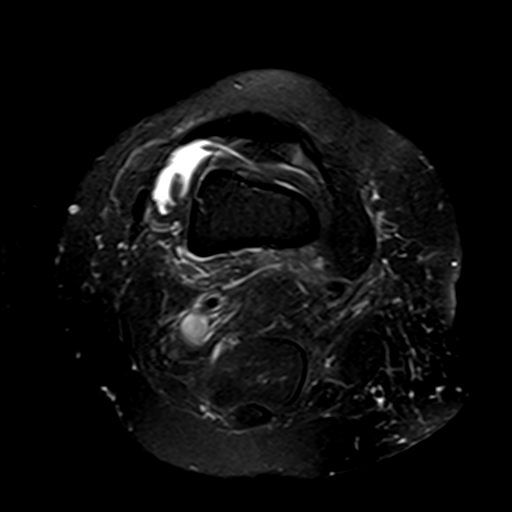
[im 34/34]
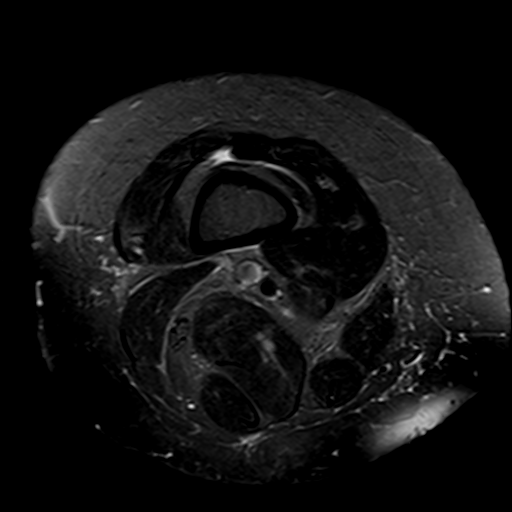

[Series 6: PD fat-sat · coronal · 4.0mm · 0.29mm/px · 6 of 33 slices shown (1 of 3)]
[im 1/33]
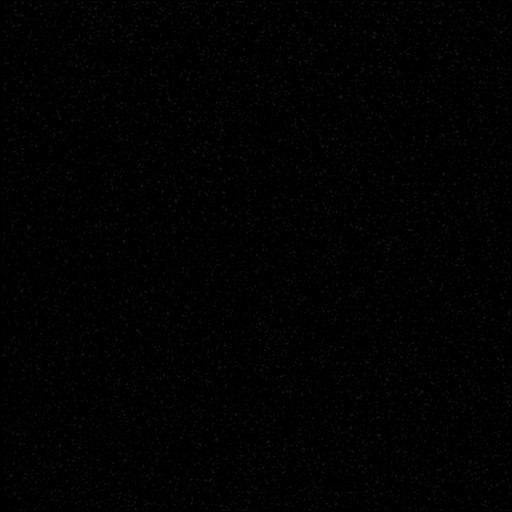
[im 7/33]
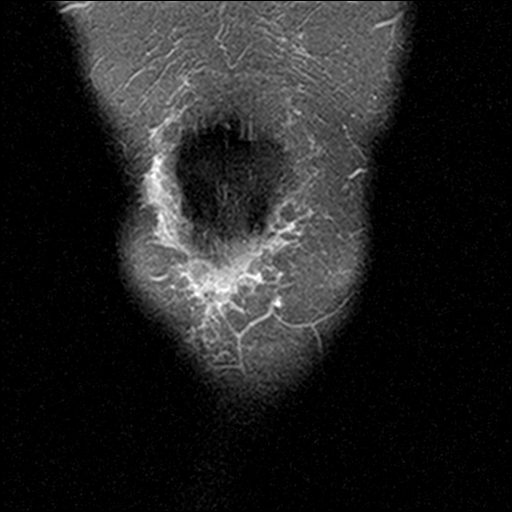
[im 13/33]
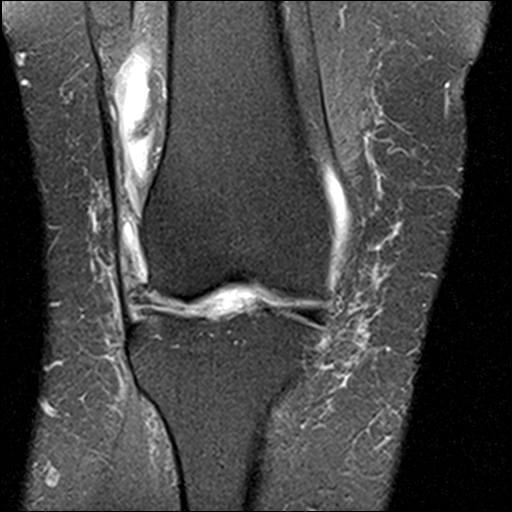
[im 20/33]
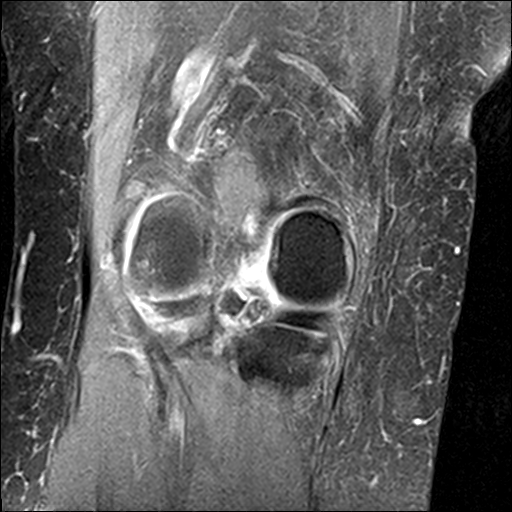
[im 26/33]
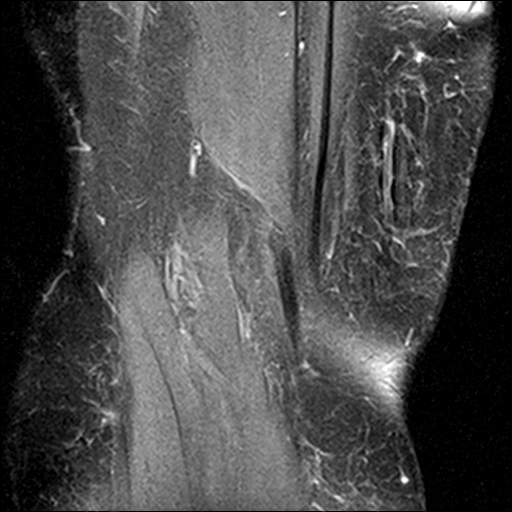
[im 33/33]
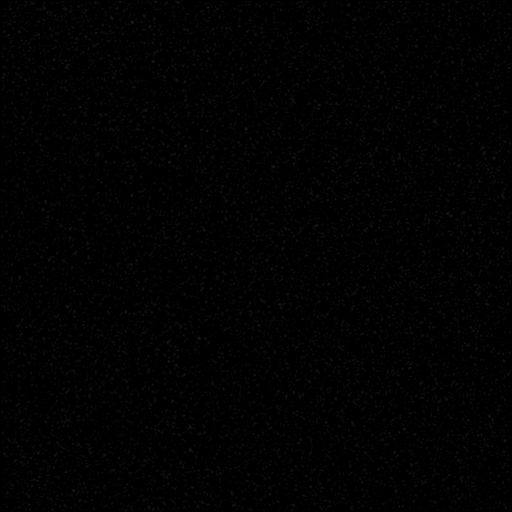

[Series 7: PD fat-sat · sagittal · 3.0mm · 0.29mm/px · 5 of 30 slices shown (2 of 3)]
[im 1/30]
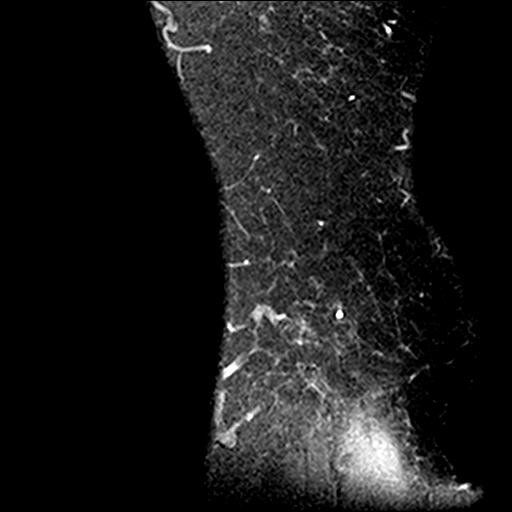
[im 8/30]
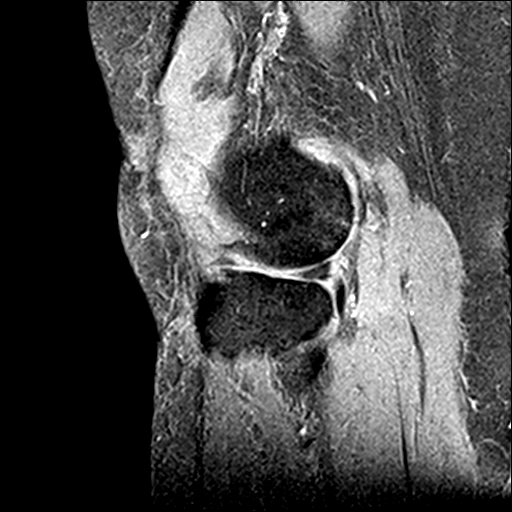
[im 15/30]
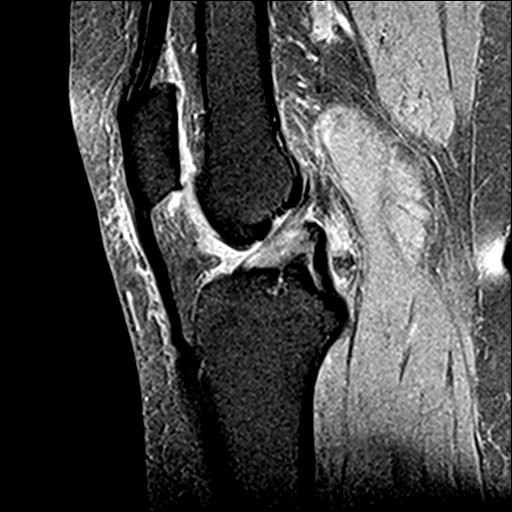
[im 22/30]
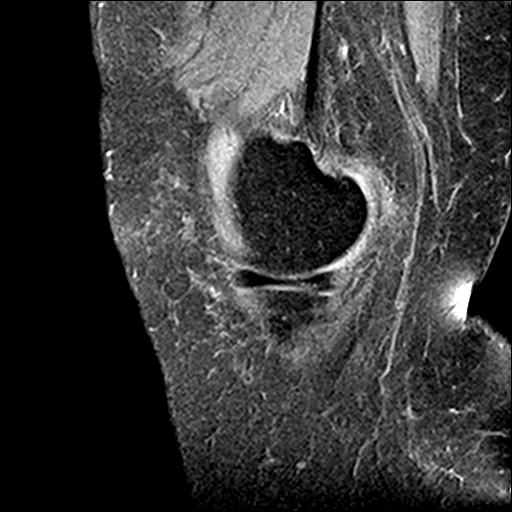
[im 30/30]
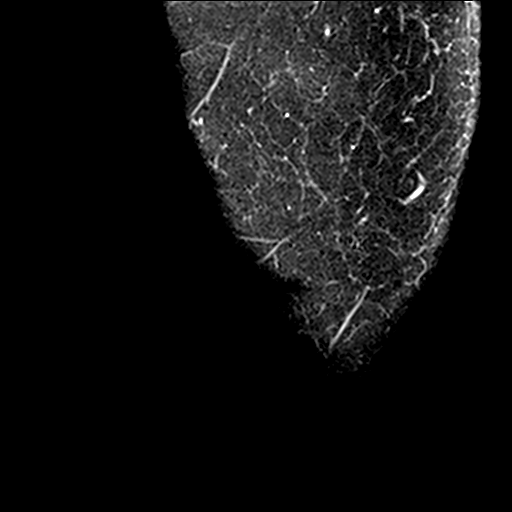

[Series 9: PD fat-sat · oblique · 2.0mm · 0.29mm/px · 2 of 14 slices shown (3 of 3)]
[im 1/14]
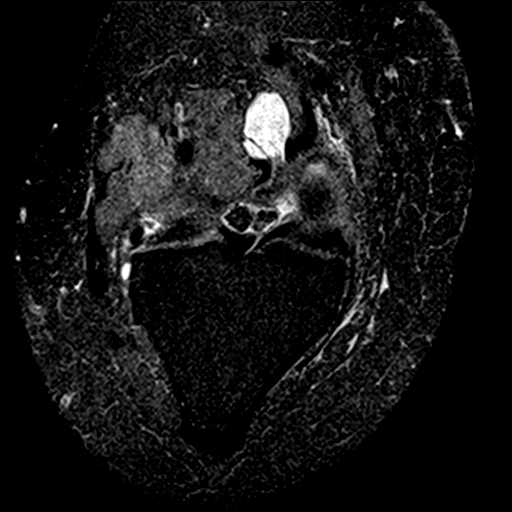
[im 14/14]
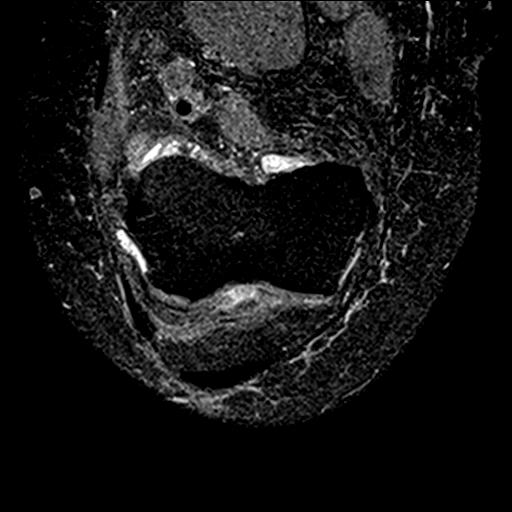

[18 of 40 positions shown; findings below may reference images not displayed]

FINDINGS: MENISCI

Medial meniscus: Mild intrasubstance degeneration with undersurface
and free edge tearing of the medial meniscal posterior horn (series
6, image 16; series 7, image 18).

Lateral meniscus: Maceration of the lateral meniscal anterior horn
and body with nonvisualization of the anterior root attachment site
(series 7, images 9-13). Intrasubstance degeneration with free edge
irregularity of the posterior horn.

LIGAMENTS

Cruciates:  Intact ACL and PCL.

Collaterals: Medial collateral ligament is intact. Lateral
collateral ligament complex is intact.

CARTILAGE

Patellofemoral:  Mild chondral thinning without focal defect.

Medial: Mild chondral thinning and surface irregularity of the
weight-bearing medial compartment without focal defect.

Lateral: Full-thickness cartilage defect of the posterior
nonweightbearing lateral femoral condyle measuring approximately 14
x 7 mm. Moderate chondral thinning of the weight-bearing lateral
compartment.

Joint: Small knee joint effusion. 2 adjacent ossified loose bodies
at the posterior joint line each measuring approximately 7 mm in
diameter (series 6, image 14).

Popliteal Fossa:  Small Baker's cyst.  Intact popliteus tendon.

Extensor Mechanism:  Intact quadriceps tendon and patellar tendon.

Bones: Mild tricompartmental joint space narrowing and marginal
osteophyte formation. No fracture or malalignment. Reactive
subchondral marrow signal changes at the posterior lateral femoral
condyle.

Other: Nonspecific mild circumferential subcutaneous edema.
IMPRESSION: 1. Maceration of the anterior horn and body of the lateral meniscus
with nonvisualization of the anterior root attachment site.
2. Mild intrasubstance degeneration with undersurface and free edge
tearing of the medial meniscal posterior horn.
3. Mild tricompartmental osteoarthritis with a focal full-thickness
cartilage defect of the posterior nonweightbearing lateral femoral
condyle.
4. Small knee joint effusion with two small adjacent ossified loose
bodies at the posterior joint line.
5. Small Baker's cyst.

## 2023-04-23 ENCOUNTER — Ambulatory Visit: Payer: Medicare HMO | Admitting: Orthopedic Surgery

## 2023-04-23 DIAGNOSIS — G8929 Other chronic pain: Secondary | ICD-10-CM

## 2023-04-23 DIAGNOSIS — M25562 Pain in left knee: Secondary | ICD-10-CM | POA: Diagnosis not present

## 2023-04-23 DIAGNOSIS — M25561 Pain in right knee: Secondary | ICD-10-CM | POA: Diagnosis not present

## 2023-04-26 ENCOUNTER — Encounter: Payer: Self-pay | Admitting: Orthopedic Surgery

## 2023-04-26 DIAGNOSIS — G8929 Other chronic pain: Secondary | ICD-10-CM | POA: Diagnosis not present

## 2023-04-26 DIAGNOSIS — M25561 Pain in right knee: Secondary | ICD-10-CM

## 2023-04-26 DIAGNOSIS — M25562 Pain in left knee: Secondary | ICD-10-CM | POA: Diagnosis not present

## 2023-04-26 NOTE — Progress Notes (Signed)
Office Visit Note   Patient: Denise Ferguson           Date of Birth: 1951-09-20           MRN: [id number] Visit Date: 04/23/2023              Requested by: [name], NP [address] [place],  [place] [address] PCP: [name], NP  Chief Complaint  Patient presents with   Right Knee - Follow-up    S/p bilateral knee injections 10/2022   Left Knee - Follow-up      HPI: Patient is a 72 year old woman with osteoarthritis both knees.  She is status post post bilateral knee injections about 6 months ago.  Patient states she has had increased lower back pain recently.  Assessment & Plan: Visit Diagnoses:  1. Chronic pain of both knees     Plan: Both knees were injected she tolerated this well.  Follow-Up Instructions: Return if symptoms worsen or fail to improve.   Ortho Exam  Patient is alert, oriented, no adenopathy, well-dressed, normal affect, normal respiratory effort. Examination of both knees there is no effusion collaterals and cruciates are stable there is crepitation with range of motion of both knees.  Patient has a negative sciatic stretch test bilaterally.  Imaging: No results found. No images are attached to the encounter.  Labs: No results found for: "HGBA1C", "ESRSEDRATE", "CRP", "LABURIC", "REPTSTATUS", "GRAMSTAIN", "CULT", "LABORGA"   No results found for: "ALBUMIN", "PREALBUMIN", "CBC"  No results found for: "MG" No results found for: "VD25OH"  No results found for: "PREALBUMIN"    Latest Ref Rng & Units 09/23/2011    9:38 AM  CBC EXTENDED  WBC 4.5 - 10.5 K/uL 4.1   RBC 3.87 - 5.11 Mil/uL 5.07   Hemoglobin 12.0 - 15.0 g/dL 19.1   HCT 47.8 - 29.5 % 36.6   Platelets 150.0 - 400.0 K/uL 237.0   NEUT# 1.4 - 7.7 K/uL 1.8   Lymph# 0.7 - 4.0 K/uL 1.9      There is no height or weight on file to calculate BMI.  Orders:  No orders of the defined types were placed in this encounter.  No orders of the defined types were  placed in this encounter.    Procedures: Large Joint Inj: bilateral knee on 04/26/2023 12:49 PM Indications: pain and diagnostic evaluation Details: 22 G 1.5 in needle, anteromedial approach  Arthrogram: No  Outcome: tolerated well, no immediate complications Procedure, treatment alternatives, risks and benefits explained, specific risks discussed. Consent was given by the patient. Immediately prior to procedure a time out was called to verify the correct patient, procedure, equipment, support staff and site/side marked as required. Patient was prepped and draped in the usual sterile fashion.      Clinical Data: No additional findings.  ROS:  All other systems negative, except as noted in the HPI. Review of Systems  Objective: Vital Signs: There were no vitals taken for this visit.  Specialty Comments:  No specialty comments available.  PMFS History: Patient Active Problem List   Diagnosis Date Noted   PSVT (paroxysmal supraventricular tachycardia) 01/29/2023   History of meniscal tear    History of colon polyps 09/23/2011   GERD (gastroesophageal reflux disease) 09/23/2011   Grave's disease 09/23/2011   HTN (hypertension) 09/23/2011   Migraines 09/23/2011   Anemia 09/23/2011   Past Medical History:  Diagnosis Date   Allergy    Anemia    Arthritis  knees   Diabetes mellitus without complication (HCC)    " Pre" DM- on Metformin    FHx: migraine headaches    GERD (gastroesophageal reflux disease)    Grave's disease    History of colon polyps    Hypertension    Hyperthyroidism    IBS (irritable bowel syndrome)    PONV (postoperative nausea and vomiting)     Family History  Problem Relation Age of Onset   Hypertension Mother    Diabetes Father    Hypertension Father    Diabetes Brother    Lung cancer Other    Alcohol abuse Other        uncle   Cancer Other        uncle   Colon cancer Neg Hx    Colon polyps Neg Hx    Esophageal cancer Neg Hx     Rectal cancer Neg Hx    Stomach cancer Neg Hx     Past Surgical History:  Procedure Laterality Date   ABDOMINAL HYSTERECTOMY     BREAST BIOPSY     CARPAL TUNNEL RELEASE  2000   bilateral   COLONOSCOPY     KNEE ARTHROSCOPY WITH LATERAL MENISECTOMY  07/16/2021   Procedure: KNEE ARTHROSCOPY WITH LATERAL MENISECTOMY;  Surgeon: Nadara Mustard, MD;  Location: [facility];  Service: Orthopedics;;   KNEE ARTHROSCOPY WITH MEDIAL MENISECTOMY Right 07/16/2021   Procedure: ARTHROSCOPIC DEBRIDEMENT RIGHT KNEE, PARTIAL MEDIAL AND LATERAL MENISECTOMIES;  Surgeon: Nadara Mustard, MD;  Location: [facility];  Service: Orthopedics;  Laterality: Right;   POLYPECTOMY     thyroid ablation     x2   TONSILLECTOMY     TUBAL LIGATION  1980   Social History   Occupational History   Occupation: retired Designer, industrial/product  Tobacco Use   Smoking status: Never   Smokeless tobacco: Never  Vaping Use   Vaping Use: Never used  Substance and Sexual Activity   Alcohol use: No    Alcohol/week: 0.0 standard drinks of alcohol   Drug use: No   Sexual activity: Not Currently    Birth control/protection: Surgical

## 2023-05-01 ENCOUNTER — Encounter: Payer: Self-pay | Admitting: Cardiology

## 2023-05-01 ENCOUNTER — Ambulatory Visit: Payer: Medicare HMO | Admitting: Cardiology

## 2023-05-01 VITALS — BP 132/71 | HR 61 | Resp 16 | Ht 61.0 in | Wt 190.0 lb

## 2023-05-01 DIAGNOSIS — R072 Precordial pain: Secondary | ICD-10-CM | POA: Insufficient documentation

## 2023-05-01 DIAGNOSIS — I471 Supraventricular tachycardia, unspecified: Secondary | ICD-10-CM

## 2023-05-01 MED ORDER — METOPROLOL SUCCINATE ER 50 MG PO TB24: 50.0000 mg | ORAL_TABLET | Freq: Every day | ORAL | 3 refills | Status: DC

## 2023-05-01 NOTE — Progress Notes (Signed)
Patient referred by Denise Hansen, NP for palpitations  Subjective:   Denise Ferguson, female    DOB: [birth year], 72 y.o.   MRN: [id number]   Chief Complaint  Patient presents with   PSVT   Follow-up    3 month     HPI  72 y.o. African American female with hypertension, prediabetes, hypothyroidism s/p RAI ablationX2 for Grave's disease, referred for palpitations by Dr. Talmage Nap  Patient has had resolution of palpitation symptoms after addition of metoprolol.  She still has episodes of retrosternal burning, only while in bed.  She does not have any exertional chest pain.   Initial consultation visit 12/2022: Patient is here with her daughter today. She has had episodes of sudden onset palpitations lasting for 15-20 min, sometimes longer, associated with shortness of breath. She also has separate episodes of chest heaviness that happens usually when she lays down at night. She drinks decaffeinated tea, does not drink alcohol.     Current Outpatient Medications:    Acetaminophen (TYLENOL EXTRA STRENGTH PO), Take by mouth as needed., Disp: , Rfl:    amLODipine (NORVASC) 10 MG tablet, Take 1 tablet by mouth daily., Disp: , Rfl:    estradiol (ESTRACE) 0.5 MG tablet, , Disp: , Rfl:    gabapentin (NEURONTIN) 300 MG capsule, 600 mg at bedtime., Disp: , Rfl:    levothyroxine (SYNTHROID, LEVOTHROID) 75 MCG tablet, Take 75 mcg by mouth every other day., Disp: , Rfl:    losartan-hydrochlorothiazide (HYZAAR) 100-25 MG per tablet, Take 1 tablet by mouth daily.  , Disp: , Rfl:    metFORMIN (GLUCOPHAGE-XR) 500 MG 24 hr tablet, , Disp: , Rfl:    metoprolol succinate (TOPROL-XL) 50 MG 24 hr tablet, Take 1 tablet (50 mg total) by mouth daily. Take with or immediately following a meal., Disp: 90 tablet, Rfl: 3   omeprazole (PRILOSEC) 40 MG capsule, Take 40 mg by mouth., Disp: , Rfl:    Accu-Chek Softclix Lancets lancets, , Disp: , Rfl:    Blood Glucose Monitoring Suppl (ACCU-CHEK GUIDE) w/Device  KIT, , Disp: , Rfl:    Cardiovascular and other pertinent studies:  Reviewed external labs and tests, independently interpreted  Lexiscan Tetrofosmin stress test 01/12/2023: Lexiscan nuclear stress test performed using 1-day protocol. Normal myocardial perfusion. Stress LVEF 60%. Low risk study.   Echocardiogram 01/06/2023:  Left ventricle cavity is normal in size and wall thickness. Normal global  wall motion. Normal LV systolic function with EF 56%. Doppler evidence of  grade I (impaired) diastolic dysfunction, normal LAP.  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  Mild tricuspid regurgitation. Estimated pulmonary artery systolic pressure  26 mmHg.   Mobile cardiac telemetry 7 days 12/29/2022 - 01/05/2023: Dominant rhythm: Sinus. HR 51-147 bpm. Avg HR 76 bpm, in sinus rhythm 3 episodes of SVT, fastest and at 176 bpm for 1 min 9 secs. 1.6% isolated SVE, <1% couplet/triplets. 0 episodes of VT. <1% isolated VE, couplets. No atrial fibrillation/atrial flutter/VT/high grade AV block, sinus pause >3sec noted. 3 patient triggered events, correlated with SVE, VE, SVT.    EKG 12/29/2022: Sinus rhythm 62 bpm Normal EKG   Recent labs: 09/05/2022: Glucose 91, BUN/Cr 14/0.92. EGFR 66. Na/K 139/3.8. Rest of the CMP normal H/H 11/36. MCV 69. Platelets 250 HbA1C 6.3% Chol 124, TG 75, HDL 55, LDL 54 TSH 0.8 normal  12/2021: HbA1C 6.0%    Review of Systems  Cardiovascular:  Positive for chest pain. Negative for dyspnea  on exertion, leg swelling, palpitations and syncope.  Respiratory:  Negative for shortness of breath.          Vitals:   05/01/23 0931  BP: 132/71  Pulse: 61  Resp: 16  SpO2: 97%     Body mass index is 35.9 kg/m. Filed Weights   05/01/23 0931  Weight: 190 lb (86.2 kg)     Objective:   Physical Exam Vitals and nursing note reviewed.  Constitutional:      General: She is not in acute distress. Neck:      Vascular: No JVD.  Cardiovascular:     Rate and Rhythm: Normal rate and regular rhythm.     Heart sounds: Normal heart sounds. No murmur heard. Pulmonary:     Effort: Pulmonary effort is normal.     Breath sounds: Normal breath sounds. No wheezing or rales.  Musculoskeletal:     Right lower leg: No edema.     Left lower leg: No edema.            Visit diagnoses:   ICD-10-CM   1. Retrosternal discomfort  R07.2     2. PSVT (paroxysmal supraventricular tachycardia)  I47.10 metoprolol succinate (TOPROL-XL) 50 MG 24 hr tablet       Meds ordered this encounter  Medications   metoprolol succinate (TOPROL-XL) 50 MG 24 hr tablet    Sig: Take 1 tablet (50 mg total) by mouth daily. Take with or immediately following a meal.    Dispense:  90 tablet    Refill:  3     Assessment & Recommendations:   72 y.o. African American female with hypertension, prediabetes, hypothyroidism s/p RAI ablationX2 for Grave's disease, PSVT  PSVT: In addition to vagal maneuvers, added metoprolol succinate 50 mg daily. Symptoms however controlled with this.  Retrosternal burning: No ischemia on stress testing 12/2022. No exertional symptoms, suspect GERD being the etiology. Monitor for now.  If she has any exertional chest tightness, will consider further workup, possibly coronary angiography.  F/u in 3 months   Elder Negus, MD Pager: [phone] Office: [phone]

## 2023-07-28 ENCOUNTER — Ambulatory Visit: Payer: Medicare HMO | Admitting: Orthopedic Surgery

## 2023-07-28 ENCOUNTER — Encounter: Payer: Self-pay | Admitting: Orthopedic Surgery

## 2023-07-28 DIAGNOSIS — M25562 Pain in left knee: Secondary | ICD-10-CM

## 2023-07-28 DIAGNOSIS — G8929 Other chronic pain: Secondary | ICD-10-CM | POA: Diagnosis not present

## 2023-07-28 DIAGNOSIS — M170 Bilateral primary osteoarthritis of knee: Secondary | ICD-10-CM

## 2023-07-28 DIAGNOSIS — M25561 Pain in right knee: Secondary | ICD-10-CM | POA: Diagnosis not present

## 2023-07-28 NOTE — Progress Notes (Signed)
Office Visit Note   Patient: Denise Ferguson           Date of Birth: 1951/04/28           MRN: [id number] Visit Date: 07/28/2023              Requested by: [name], NP [address] [place],  [place] [address] PCP: [name], NP  Chief Complaint  Patient presents with   Left Knee - Pain   Right Knee - Pain      HPI: Patient is a 72 year old woman who is seen in follow-up for osteoarthritis both knees.  Patient states the pain is worse in the left knee than the right knee.  Patient states she received about 1 week of relief from her previous steroid injections.  Patient has been to physical therapy without relief she has tried anti-inflammatories including Voltaren gel.  She states she has pain with activities of daily living.  She has no mechanical symptoms with catching or locking.  Patient has a family history of osteoarthritis and a family history of knee replacements. Assessment & Plan: Visit Diagnoses:  1. Chronic pain of both knees   2. Bilateral primary osteoarthritis of knee     Plan: Patient would like to proceed with a left total knee arthroplasty.  Risks and benefits were discussed including the postoperative course.  Patient states she understands and is ready to proceed at this time.  Follow-Up Instructions: Return if symptoms worsen or fail to improve.   Ortho Exam  Patient is alert, oriented, no adenopathy, well-dressed, normal affect, normal respiratory effort. Examination patient has a mild effusion of both knees she has good range of motion collaterals and cruciates are stable bilaterally.  Radiographs show osteoarthritis of both knees.  Patient is tender to palpation of the medial lateral joint line bilaterally.  Imaging: No results found. No images are attached to the encounter.  Labs: No results found for: "HGBA1C", "ESRSEDRATE", "CRP", "LABURIC", "REPTSTATUS", "GRAMSTAIN", "CULT", "LABORGA"   No results found for: "ALBUMIN",  "PREALBUMIN", "CBC"  No results found for: "MG" No results found for: "VD25OH"  No results found for: "PREALBUMIN"    Latest Ref Rng & Units 09/23/2011    9:38 AM  CBC EXTENDED  WBC 4.5 - 10.5 K/uL 4.1   RBC 3.87 - 5.11 Mil/uL 5.07   Hemoglobin 12.0 - 15.0 g/dL 19.1   HCT 47.8 - 29.5 % 36.6   Platelets 150.0 - 400.0 K/uL 237.0   NEUT# 1.4 - 7.7 K/uL 1.8   Lymph# 0.7 - 4.0 K/uL 1.9      There is no height or weight on file to calculate BMI.  Orders:  No orders of the defined types were placed in this encounter.  No orders of the defined types were placed in this encounter.    Procedures: No procedures performed  Clinical Data: No additional findings.  ROS:  All other systems negative, except as noted in the HPI. Review of Systems  Objective: Vital Signs: There were no vitals taken for this visit.  Specialty Comments:  No specialty comments available.  PMFS History: Patient Active Problem List   Diagnosis Date Noted   Retrosternal discomfort 05/01/2023   PSVT (paroxysmal supraventricular tachycardia) 01/29/2023   History of meniscal tear    History of colon polyps 09/23/2011   GERD (gastroesophageal reflux disease) 09/23/2011   Grave's disease 09/23/2011   HTN (hypertension) 09/23/2011   Migraines 09/23/2011   Anemia  09/23/2011   Past Medical History:  Diagnosis Date   Allergy    Anemia    Arthritis    knees   Diabetes mellitus without complication (HCC)    " Pre" DM- on Metformin    FHx: migraine headaches    GERD (gastroesophageal reflux disease)    Grave's disease    History of colon polyps    Hypertension    Hyperthyroidism    IBS (irritable bowel syndrome)    PONV (postoperative nausea and vomiting)     Family History  Problem Relation Age of Onset   Hypertension Mother    Diabetes Father    Hypertension Father    Diabetes Brother    Lung cancer Other    Alcohol abuse Other        uncle   Cancer Other        uncle   Colon cancer  Neg Hx    Colon polyps Neg Hx    Esophageal cancer Neg Hx    Rectal cancer Neg Hx    Stomach cancer Neg Hx     Past Surgical History:  Procedure Laterality Date   ABDOMINAL HYSTERECTOMY     BREAST BIOPSY     CARPAL TUNNEL RELEASE  2000   bilateral   COLONOSCOPY     KNEE ARTHROSCOPY WITH LATERAL MENISECTOMY  07/16/2021   Procedure: KNEE ARTHROSCOPY WITH LATERAL MENISECTOMY;  Surgeon: Nadara Mustard, MD;  Location: [facility];  Service: Orthopedics;;   KNEE ARTHROSCOPY WITH MEDIAL MENISECTOMY Right 07/16/2021   Procedure: ARTHROSCOPIC DEBRIDEMENT RIGHT KNEE, PARTIAL MEDIAL AND LATERAL MENISECTOMIES;  Surgeon: Nadara Mustard, MD;  Location: [facility];  Service: Orthopedics;  Laterality: Right;   POLYPECTOMY     thyroid ablation     x2   TONSILLECTOMY     TUBAL LIGATION  1980   Social History   Occupational History   Occupation: retired Designer, industrial/product  Tobacco Use   Smoking status: Never   Smokeless tobacco: Never  Vaping Use   Vaping status: Never Used  Substance and Sexual Activity   Alcohol use: No    Alcohol/week: 0.0 standard drinks of alcohol   Drug use: No   Sexual activity: Not Currently    Birth control/protection: Surgical

## 2023-07-30 ENCOUNTER — Ambulatory Visit: Payer: Medicare HMO | Admitting: Cardiology

## 2023-08-26 NOTE — Progress Notes (Unsigned)
Cardiology Office Note:  .   Date:  08/26/2023  ID:  Denise Ferguson, DOB 02/07/1951, MRN [id number] PCP: [name], NP  [facility] Providers Cardiologist:  Truett Mainland, MD PCP: [name], NP  No chief complaint on file.     History of Present Illness: .    Denise Ferguson is a 72 y.o. female with hypertension, prediabetes, hypothyroidism s/p RAI ablationX2 for Grave's disease, PSVT    There were no vitals filed for this visit.   ROS: *** ROS   Studies Reviewed: Marland Kitchen       Lexiscan Tetrofosmin stress test 01/12/2023: Lexiscan nuclear stress test performed using 1-day protocol. Normal myocardial perfusion. Stress LVEF 60%. Low risk study.   Echocardiogram 01/06/2023:  Left ventricle cavity is normal in size and wall thickness. Normal global  wall motion. Normal LV systolic function with EF 56%. Doppler evidence of  grade I (impaired) diastolic dysfunction, normal LAP.  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  Mild tricuspid regurgitation. Estimated pulmonary artery systolic pressure  26 mmHg.    Mobile cardiac telemetry 7 days 12/29/2022 - 01/05/2023: Dominant rhythm: Sinus. HR 51-147 bpm. Avg HR 76 bpm, in sinus rhythm 3 episodes of SVT, fastest and at 176 bpm for 1 min 9 secs. 1.6% isolated SVE, <1% couplet/triplets. 0 episodes of VT. <1% isolated VE, couplets. No atrial fibrillation/atrial flutter/VT/high grade AV block, sinus pause >3sec noted. 3 patient triggered events, correlated with SVE, VE, SVT.  09/05/2022: Glucose 91, BUN/Cr 14/0.92. EGFR 66. Na/K 139/3.8. Rest of the CMP normal H/H 11/36. MCV 69. Platelets 250 HbA1C 6.3% Chol 124, TG 75, HDL 55, LDL 54 TSH 0.8 normal  *** Risk Assessment/Calculations:   {Does this patient have ATRIAL FIBRILLATION?:[phone]}   Physical Exam:   Physical Exam   VISIT DIAGNOSES: No diagnosis found.   ASSESSMENT AND PLAN: .    Denise Ferguson is a  72 y.o. female with hypertension, prediabetes, hypothyroidism s/p RAI ablationX2 for Grave's disease, PSVT   *** PSVT: In addition to vagal maneuvers, *** metoprolol succinate 50 mg daily. Symptoms controlled with this.   *** Retrosternal burning: No ischemia on stress testing 12/2022. No exertional symptoms, suspect GERD being the etiology. Monitor for now.  If she has any exertional chest tightness, will consider further workup, possibly coronary angiography.  {Are you ordering a CV Procedure (e.g. stress test, cath, DCCV, TEE, etc)?   Press F2        :[id number]}    No orders of the defined types were placed in this encounter.    F/u in ***  Signed, Elder Negus, MD

## 2023-08-27 ENCOUNTER — Encounter: Payer: Self-pay | Admitting: Cardiology

## 2023-08-27 ENCOUNTER — Ambulatory Visit: Payer: Medicare HMO | Attending: Cardiology | Admitting: Cardiology

## 2023-08-27 ENCOUNTER — Ambulatory Visit: Payer: Medicare HMO | Admitting: Cardiology

## 2023-08-27 VITALS — BP 124/70 | HR 72 | Ht 62.0 in | Wt 187.0 lb

## 2023-08-27 DIAGNOSIS — Z01810 Encounter for preprocedural cardiovascular examination: Secondary | ICD-10-CM | POA: Diagnosis not present

## 2023-08-27 DIAGNOSIS — I471 Supraventricular tachycardia, unspecified: Secondary | ICD-10-CM | POA: Diagnosis not present

## 2023-08-27 NOTE — Patient Instructions (Signed)
Medication Instructions:  Your physician recommends that you continue on your current medications as directed. Please refer to the Current Medication list given to you today.  *If you need a refill on your cardiac medications before your next appointment, please call your pharmacy*   Lab Work: None.  If you have labs (blood work) drawn today and your tests are completely normal, you will receive your results only by: MyChart Message (if you have MyChart) OR A paper copy in the mail If you have any lab test that is abnormal or we need to change your treatment, we will call you to review the results.   Testing/Procedures: None.   Follow-Up:  Your next appointment:   1 year(s)  Provider:   Dr. Truett Mainland

## 2023-09-04 NOTE — Progress Notes (Signed)
Surgical Instructions   Your procedure is scheduled on October 23, 24. Report to [facility] Main Entrance "A" at 0530 A.M., then check in with the Admitting office. Any questions or running late day of surgery: call [phone]  Questions prior to your surgery date: call [phone], Monday-Friday, 8am-4pm. If you experience any cold or flu symptoms such as cough, fever, chills, shortness of breath, etc. between now and your scheduled surgery, please notify us at the above number.     Remember:  Do not eat after midnight the night before your surgery  You may drink clear liquids until 04:30 the morning of your surgery.   Clear liquids allowed are: Water, Non-Citrus Juices (without pulp), Carbonated Beverages, Clear Tea, Black Coffee Only (NO MILK, CREAM OR POWDERED CREAMER of any kind), and Gatorade.    Take these medicines the morning of surgery with A SIP OF WATER   amLODipine (NORVASC)  estradiol (ESTRACE)  levothyroxine (SYNTHROID, LEVOTHROID)  metoprolol succinate (TOPROL-XL)  omeprazole (PRILOSEC)   May take these medicines IF NEEDED:  acetaminophen (TYLENOL  carboxymethylcellulose (REFRESH PLUS)  olopatadine (PATADAY)   Do not take your Metformin for 48 hours prior to surgery.  Last Dose is 09/14/23.    One week prior to surgery, STOP taking any Aspirin (unless otherwise instructed by your surgeon) Aleve, Naproxen, Ibuprofen, Motrin, Advil, Goody's, BC's, all herbal medications, fish oil, and non-prescription vitamins.                     Do NOT Smoke (Tobacco/Vaping) for 24 hours prior to your procedure.  If you use a CPAP at night, you may bring your mask/headgear for your overnight stay.   You will be asked to remove any contacts, glasses, piercing's, hearing aid's, dentures/partials prior to surgery. Please bring cases for these items if needed.    Patients discharged the day of surgery will not be allowed to drive home, and someone needs to stay with them  for 24 hours.  SURGICAL WAITING ROOM VISITATION Patients may have no more than 2 support people in the waiting area - these visitors may rotate.   Pre-op nurse will coordinate an appropriate time for 1 ADULT support person, who may not rotate, to accompany patient in pre-op.  Children under the age of 61 must have an adult with them who is not the patient and must remain in the main waiting area with an adult.  If the patient needs to stay at the hospital during part of their recovery, the visitor guidelines for inpatient rooms apply.  Please refer to the [facility] website for the visitor guidelines for any additional information.   If you received a COVID test during your pre-op visit  it is requested that you wear a mask when out in public, stay away from anyone that may not be feeling well and notify your surgeon if you develop symptoms. If you have been in contact with anyone that has tested positive in the last 10 days please notify you surgeon.      Pre-operative 5 CHG Bathing Instructions   You can play a key role in reducing the risk of infection after surgery. Your skin needs to be as free of germs as possible. You can reduce the number of germs on your skin by washing with CHG (chlorhexidine gluconate) soap before surgery. CHG is an antiseptic soap that kills germs and continues to kill germs even after washing.   DO NOT use if you have an allergy  to chlorhexidine/CHG or antibacterial soaps. If your skin becomes reddened or irritated, stop using the CHG and notify one of our RNs at [phone].   Please shower with the CHG soap starting 4 days before surgery using the following schedule:     Please keep in mind the following:  DO NOT shave, including legs and underarms, starting the day of your first shower.   You may shave your face at any point before/day of surgery.  Place clean sheets on your bed the day you start using CHG soap. Use a clean washcloth (not used since  being washed) for each shower. DO NOT sleep with pets once you start using the CHG.   CHG Shower Instructions:  Wash your face and private area with normal soap. If you choose to wash your hair, wash first with your normal shampoo.  After you use shampoo/soap, rinse your hair and body thoroughly to remove shampoo/soap residue.  Turn the water OFF and apply about 3 tablespoons (45 ml) of CHG soap to a CLEAN washcloth.  Apply CHG soap ONLY FROM YOUR NECK DOWN TO YOUR TOES (washing for 3-5 minutes)  DO NOT use CHG soap on face, private areas, open wounds, or sores.  Pay special attention to the area where your surgery is being performed.  If you are having back surgery, having someone wash your back for you may be helpful. Wait 2 minutes after CHG soap is applied, then you may rinse off the CHG soap.  Pat dry with a clean towel  Put on clean clothes/pajamas   If you choose to wear lotion, please use ONLY the CHG-compatible lotions on the back of this paper.   Additional instructions for the day of surgery: DO NOT APPLY any lotions, deodorants, cologne, or perfumes.   Do not bring valuables to the hospital. [facility] is not responsible for any belongings/valuables. Do not wear nail polish, gel polish, artificial nails, or any other type of covering on natural nails (fingers and toes) Do not wear jewelry or makeup Put on clean/comfortable clothes.  Please brush your teeth.  Ask your nurse before applying any prescription medications to the skin.     CHG Compatible Lotions   Aveeno Moisturizing lotion  Cetaphil Moisturizing Cream  Cetaphil Moisturizing Lotion  Clairol Herbal Essence Moisturizing Lotion, Dry Skin  Clairol Herbal Essence Moisturizing Lotion, Extra Dry Skin  Clairol Herbal Essence Moisturizing Lotion, Normal Skin  Curel Age Defying Therapeutic Moisturizing Lotion with Alpha Hydroxy  Curel Extreme Care Body Lotion  Curel Soothing Hands Moisturizing Hand Lotion  Curel  Therapeutic Moisturizing Cream, Fragrance-Free  Curel Therapeutic Moisturizing Lotion, Fragrance-Free  Curel Therapeutic Moisturizing Lotion, Original Formula  Eucerin Daily Replenishing Lotion  Eucerin Dry Skin Therapy Plus Alpha Hydroxy Crme  Eucerin Dry Skin Therapy Plus Alpha Hydroxy Lotion  Eucerin Original Crme  Eucerin Original Lotion  Eucerin Plus Crme Eucerin Plus Lotion  Eucerin TriLipid Replenishing Lotion  Keri Anti-Bacterial Hand Lotion  Keri Deep Conditioning Original Lotion Dry Skin Formula Softly Scented  Keri Deep Conditioning Original Lotion, Fragrance Free Sensitive Skin Formula  Keri Lotion Fast Absorbing Fragrance Free Sensitive Skin Formula  Keri Lotion Fast Absorbing Softly Scented Dry Skin Formula  Keri Original Lotion  Keri Skin Renewal Lotion Keri Silky Smooth Lotion  Keri Silky Smooth Sensitive Skin Lotion  Nivea Body Creamy Conditioning Oil  Nivea Body Extra Enriched Teacher, adult education Moisturizing Lotion Nivea Crme  Nivea Skin Firming Lotion  NutraDerm  30 Skin Lotion  NutraDerm Skin Lotion  NutraDerm Therapeutic Skin Cream  NutraDerm Therapeutic Skin Lotion  ProShield Protective Hand Cream  Provon moisturizing lotion  Please read over the following fact sheets that you were given.

## 2023-09-07 ENCOUNTER — Encounter (HOSPITAL_COMMUNITY)
Admission: RE | Admit: 2023-09-07 | Discharge: 2023-09-07 | Disposition: A | Discharge status: Home / Self Care | Payer: Medicare HMO | Source: Ambulatory Visit | Attending: Orthopedic Surgery | Admitting: Orthopedic Surgery

## 2023-09-07 ENCOUNTER — Encounter (HOSPITAL_COMMUNITY): Payer: Self-pay

## 2023-09-07 ENCOUNTER — Other Ambulatory Visit: Payer: Self-pay

## 2023-09-07 VITALS — BP 116/60 | HR 77 | Temp 98.1°F | Resp 18 | Ht 61.75 in | Wt 190.7 lb

## 2023-09-07 DIAGNOSIS — Z01812 Encounter for preprocedural laboratory examination: Secondary | ICD-10-CM | POA: Insufficient documentation

## 2023-09-07 DIAGNOSIS — E0500 Thyrotoxicosis with diffuse goiter without thyrotoxic crisis or storm: Secondary | ICD-10-CM | POA: Diagnosis not present

## 2023-09-07 DIAGNOSIS — I471 Supraventricular tachycardia, unspecified: Secondary | ICD-10-CM | POA: Insufficient documentation

## 2023-09-07 DIAGNOSIS — Z01818 Encounter for other preprocedural examination: Secondary | ICD-10-CM

## 2023-09-07 DIAGNOSIS — E119 Type 2 diabetes mellitus without complications: Secondary | ICD-10-CM | POA: Insufficient documentation

## 2023-09-07 HISTORY — DX: Personal history of other diseases of the nervous system and sense organs: Z86.69

## 2023-09-07 HISTORY — DX: Supraventricular tachycardia, unspecified: I47.10

## 2023-09-07 LAB — SURGICAL PCR SCREEN
MRSA, PCR: NEGATIVE
Staphylococcus aureus: NEGATIVE

## 2023-09-07 LAB — CBC
HCT: 35.9 % — ABNORMAL LOW (ref 36.0–46.0)
Hemoglobin: 11.2 g/dL — ABNORMAL LOW (ref 12.0–15.0)
MCH: 22.1 pg — ABNORMAL LOW (ref 26.0–34.0)
MCHC: 31.2 g/dL (ref 30.0–36.0)
MCV: 70.8 fL — ABNORMAL LOW (ref 80.0–100.0)
Platelets: 255 10*3/uL (ref 150–400)
RBC: 5.07 MIL/uL (ref 3.87–5.11)
RDW: 15.1 % (ref 11.5–15.5)
WBC: 4.9 10*3/uL (ref 4.0–10.5)
nRBC: 0 % (ref 0.0–0.2)

## 2023-09-07 LAB — BASIC METABOLIC PANEL
Anion gap: 10 (ref 5–15)
BUN: 19 mg/dL (ref 8–23)
CO2: 25 mmol/L (ref 22–32)
Calcium: 9 mg/dL (ref 8.9–10.3)
Chloride: 106 mmol/L (ref 98–111)
Creatinine, Ser: 1.08 mg/dL — ABNORMAL HIGH (ref 0.44–1.00)
GFR, Estimated: 54 mL/min — ABNORMAL LOW (ref >60)
Glucose, Bld: 102 mg/dL — ABNORMAL HIGH (ref 70–99)
Potassium: 3.9 mmol/L (ref 3.5–5.1)
Sodium: 141 mmol/L (ref 135–145)

## 2023-09-07 LAB — HEMOGLOBIN A1C
Hgb A1c MFr Bld: 6.1 % — ABNORMAL HIGH (ref 4.8–5.6)
Mean Plasma Glucose: 128.37 mg/dL

## 2023-09-07 LAB — GLUCOSE, CAPILLARY: Glucose-Capillary: 113 mg/dL — ABNORMAL HIGH (ref 70–99)

## 2023-09-07 NOTE — Progress Notes (Signed)
PCP - Zoe Lan, NP Cardiologist - Dr. Truett Mainland Endocrinologist- Dr. Dorisann Frames   PPM/ICD - denies   Chest x-ray - pt states she had one this year, unsure where, and states it was normal (not seeing one in the chart) EKG - 08/27/23 Stress Test - 01/12/23 ECHO - 01/06/23 Cardiac Cath - denies  Sleep Study - denies   Fasting Blood Sugar - 90-120 Pt states she only spot checks her CBG rarely  Last dose of GLP1 agonist-  n/a   ASA/Blood Thinner Instructions: n/a   ERAS Protcol - yes, no drink   COVID TEST- n/a   Anesthesia review: yes, cardiac hx  Patient denies shortness of breath, fever, cough and chest pain at PAT appointment   All instructions explained to the patient, with a verbal understanding of the material. Patient agrees to go over the instructions while at home for a better understanding.  The opportunity to ask questions was provided.

## 2023-09-07 NOTE — Progress Notes (Signed)
Surgical Instructions   Your procedure is scheduled on September 16, 2023. Report to [facility] Main Entrance "A" at 0530 A.M., then check in with the Admitting office. Any questions or running late day of surgery: call [phone]  Questions prior to your surgery date: call [phone], Monday-Friday, 8am-4pm. If you experience any cold or flu symptoms such as cough, fever, chills, shortness of breath, etc. between now and your scheduled surgery, please notify us at the above number.     Remember:  Do not eat after midnight the night before your surgery  You may drink clear liquids until 04:30 the morning of your surgery.   Clear liquids allowed are: Water, Non-Citrus Juices (without pulp), Carbonated Beverages, Clear Tea, Black Coffee Only (NO MILK, CREAM OR POWDERED CREAMER of any kind), and Gatorade.    Take these medicines the morning of surgery with A SIP OF WATER  amLODipine (NORVASC)  estradiol (ESTRACE)  levothyroxine (SYNTHROID, LEVOTHROID)  metoprolol succinate (TOPROL-XL)  omeprazole (PRILOSEC)   May take these medicines IF NEEDED: acetaminophen (TYLENOL  carboxymethylcellulose (REFRESH PLUS)  olopatadine (PATADAY)    One week prior to surgery, STOP taking any Aspirin (unless otherwise instructed by your surgeon) Aleve, Naproxen, Ibuprofen, Motrin, Advil, Goody's, BC's, all herbal medications, fish oil, and non-prescription vitamins.           WHAT DO I DO ABOUT MY DIABETES MEDICATION?   Do not take metFORMIN (GLUCOPHAGE-XR) the morning of surgery.    HOW TO MANAGE YOUR DIABETES BEFORE AND AFTER SURGERY  Why is it important to control my blood sugar before and after surgery? Improving blood sugar levels before and after surgery helps healing and can limit problems. A way of improving blood sugar control is eating a healthy diet by:  Eating less sugar and carbohydrates  Increasing activity/exercise  Talking with your doctor about reaching your blood sugar  goals High blood sugars (greater than 180 mg/dL) can raise your risk of infections and slow your recovery, so you will need to focus on controlling your diabetes during the weeks before surgery. Make sure that the doctor who takes care of your diabetes knows about your planned surgery including the date and location.  How do I manage my blood sugar before surgery? Check your blood sugar at least 4 times a day, starting 2 days before surgery, to make sure that the level is not too high or low.  Check your blood sugar the morning of your surgery when you wake up and every 2 hours until you get to the Short Stay unit.  If your blood sugar is less than 70 mg/dL, you will need to treat for low blood sugar: Do not take insulin. Treat a low blood sugar (less than 70 mg/dL) with  cup of clear juice (cranberry or apple), 4 glucose tablets, OR glucose gel. Recheck blood sugar in 15 minutes after treatment (to make sure it is greater than 70 mg/dL). If your blood sugar is not greater than 70 mg/dL on recheck, call [phone] for further instructions. Report your blood sugar to the short stay nurse when you get to Short Stay.  If you are admitted to the hospital after surgery: Your blood sugar will be checked by the staff and you will probably be given insulin after surgery (instead of oral diabetes medicines) to make sure you have good blood sugar levels. The goal for blood sugar control after surgery is 80-180 mg/dL.  Do NOT Smoke (Tobacco/Vaping) for 24 hours prior to your procedure.  If you use a CPAP at night, you may bring your mask/headgear for your overnight stay.   You will be asked to remove any contacts, glasses, piercing's, hearing aid's, dentures/partials prior to surgery. Please bring cases for these items if needed.    Patients discharged the day of surgery will not be allowed to drive home, and someone needs to stay with them for 24 hours.  SURGICAL WAITING ROOM  VISITATION Patients may have no more than 2 support people in the waiting area - these visitors may rotate.   Pre-op nurse will coordinate an appropriate time for 1 ADULT support person, who may not rotate, to accompany patient in pre-op.  Children under the age of 27 must have an adult with them who is not the patient and must remain in the main waiting area with an adult.  If the patient needs to stay at the hospital during part of their recovery, the visitor guidelines for inpatient rooms apply.  Please refer to the [facility] website for the visitor guidelines for any additional information.   If you received a COVID test during your pre-op visit  it is requested that you wear a mask when out in public, stay away from anyone that may not be feeling well and notify your surgeon if you develop symptoms. If you have been in contact with anyone that has tested positive in the last 10 days please notify you surgeon.      Pre-operative 5 CHG Bathing Instructions   You can play a key role in reducing the risk of infection after surgery. Your skin needs to be as free of germs as possible. You can reduce the number of germs on your skin by washing with CHG (chlorhexidine gluconate) soap before surgery. CHG is an antiseptic soap that kills germs and continues to kill germs even after washing.   DO NOT use if you have an allergy to chlorhexidine/CHG or antibacterial soaps. If your skin becomes reddened or irritated, stop using the CHG and notify one of our RNs at [phone].   Please shower with the CHG soap starting 4 days before surgery using the following schedule:     Please keep in mind the following:  DO NOT shave, including legs and underarms, starting the day of your first shower.   You may shave your face at any point before/day of surgery.  Place clean sheets on your bed the day you start using CHG soap. Use a clean washcloth (not used since being washed) for each shower. DO NOT  sleep with pets once you start using the CHG.   CHG Shower Instructions:  Wash your face and private area with normal soap. If you choose to wash your hair, wash first with your normal shampoo.  After you use shampoo/soap, rinse your hair and body thoroughly to remove shampoo/soap residue.  Turn the water OFF and apply about 3 tablespoons (45 ml) of CHG soap to a CLEAN washcloth.  Apply CHG soap ONLY FROM YOUR NECK DOWN TO YOUR TOES (washing for 3-5 minutes)  DO NOT use CHG soap on face, private areas, open wounds, or sores.  Pay special attention to the area where your surgery is being performed.  If you are having back surgery, having someone wash your back for you may be helpful. Wait 2 minutes after CHG soap is applied, then you may rinse off the CHG soap.  Pat dry with a  clean towel  Put on clean clothes/pajamas   If you choose to wear lotion, please use ONLY the CHG-compatible lotions on the back of this paper.   Additional instructions for the day of surgery: DO NOT APPLY any lotions, deodorants, cologne, or perfumes.   Do not bring valuables to the hospital. [facility] is not responsible for any belongings/valuables. Do not wear nail polish, gel polish, artificial nails, or any other type of covering on natural nails (fingers and toes) Do not wear jewelry or makeup Put on clean/comfortable clothes.  Please brush your teeth.  Ask your nurse before applying any prescription medications to the skin.     CHG Compatible Lotions   Aveeno Moisturizing lotion  Cetaphil Moisturizing Cream  Cetaphil Moisturizing Lotion  Clairol Herbal Essence Moisturizing Lotion, Dry Skin  Clairol Herbal Essence Moisturizing Lotion, Extra Dry Skin  Clairol Herbal Essence Moisturizing Lotion, Normal Skin  Curel Age Defying Therapeutic Moisturizing Lotion with Alpha Hydroxy  Curel Extreme Care Body Lotion  Curel Soothing Hands Moisturizing Hand Lotion  Curel Therapeutic Moisturizing Cream,  Fragrance-Free  Curel Therapeutic Moisturizing Lotion, Fragrance-Free  Curel Therapeutic Moisturizing Lotion, Original Formula  Eucerin Daily Replenishing Lotion  Eucerin Dry Skin Therapy Plus Alpha Hydroxy Crme  Eucerin Dry Skin Therapy Plus Alpha Hydroxy Lotion  Eucerin Original Crme  Eucerin Original Lotion  Eucerin Plus Crme Eucerin Plus Lotion  Eucerin TriLipid Replenishing Lotion  Keri Anti-Bacterial Hand Lotion  Keri Deep Conditioning Original Lotion Dry Skin Formula Softly Scented  Keri Deep Conditioning Original Lotion, Fragrance Free Sensitive Skin Formula  Keri Lotion Fast Absorbing Fragrance Free Sensitive Skin Formula  Keri Lotion Fast Absorbing Softly Scented Dry Skin Formula  Keri Original Lotion  Keri Skin Renewal Lotion Keri Silky Smooth Lotion  Keri Silky Smooth Sensitive Skin Lotion  Nivea Body Creamy Conditioning Oil  Nivea Body Extra Enriched Lotion  Nivea Body Original Lotion  Nivea Body Sheer Moisturizing Lotion Nivea Crme  Nivea Skin Firming Lotion  NutraDerm 30 Skin Lotion  NutraDerm Skin Lotion  NutraDerm Therapeutic Skin Cream  NutraDerm Therapeutic Skin Lotion  ProShield Protective Hand Cream  Provon moisturizing lotion  Please read over the following fact sheets that you were given.

## 2023-09-08 NOTE — Progress Notes (Signed)
Anesthesia Chart Review:  72 year old female follows with cardiology for history of TN and PSVT which is currently well-controlled on metoprolol and as needed vagal maneuvers.  Stress testing 12/2022 was nonischemic.  Echo 12/2022 showed EF 56%, possible PFO with right-to-left shunting, mild tricuspid regurgitation.  She was last seen by Dr. Rosemary Holms 08/27/2023 for preop evaluation.  Per note, "No anginal symptoms, normal stress test in 12/2022. Low perioperative cardiac risk for upcoming knee surgery."  Other pertinent history includes prediabetes A1c 6.1 on 09/07/2023), migraines, hypothyroidism s/p RAI ablation x 2 for Graves' disease.  Preop labs reviewed, mild anemia with hemoglobin 9.2, otherwise unremarkable.  EKG 08/27/2023: Sinus bradycardia.  Rate 57.  7-day event monitor 01/13/2023: Dominant rhythm: Sinus. HR 51-147 bpm. Avg HR 76 bpm, in sinus rhythm 3 episodes of SVT, fastest and at 176 bpm for 1 min 9 secs. 1.6% isolated SVE, <1% couplet/triplets. 0 episodes of VT. <1% isolated VE, couplets. No atrial fibrillation/atrial flutter/VT/high grade AV block, sinus pause >3sec noted. 3 patient triggered events, correlated with SVE, VE, SVT.  Lexiscan Tetrofosmin stress test 01/12/2023: Lexiscan nuclear stress test performed using 1-day protocol. Normal myocardial perfusion. Stress LVEF 60%. Low risk study.  Echocardiogram 01/06/2023:  Left ventricle cavity is normal in size and wall thickness. Normal global  wall motion. Normal LV systolic function with EF 56%. Doppler evidence of  grade I (impaired) diastolic dysfunction, normal LAP.  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  Mild tricuspid regurgitation. Estimated pulmonary artery systolic pressure  26 mmHg.     Zannie Cove [facility] Short Stay Center/Anesthesiology Phone [phone] 09/08/2023 4:26 PM

## 2023-09-08 NOTE — Anesthesia Preprocedure Evaluation (Addendum)
Anesthesia Evaluation  Patient identified by MRN, date of birth, ID band Patient awake    Reviewed: Allergy & Precautions, H&P , NPO status , Patient's Chart, lab work & pertinent test results  Airway Mallampati: II  TM Distance: >3 FB Neck ROM: Full    Dental no notable dental hx.    Pulmonary neg pulmonary ROS   Pulmonary exam normal breath sounds clear to auscultation       Cardiovascular hypertension, Pt. on medications Normal cardiovascular exam Rhythm:Regular Rate:Normal     Neuro/Psych negative neurological ROS  negative psych ROS   GI/Hepatic negative GI ROS, Neg liver ROS,,,  Endo/Other  diabetes, Type 2 Hyperthyroidism   Renal/GU negative Renal ROS  negative genitourinary   Musculoskeletal negative musculoskeletal ROS (+)    Abdominal   Peds negative pediatric ROS (+)  Hematology negative hematology ROS (+)   Anesthesia Other Findings   Reproductive/Obstetrics negative OB ROS                             Anesthesia Physical Anesthesia Plan  ASA: 3  Anesthesia Plan: Spinal   Post-op Pain Management: Regional block*   Induction: Intravenous  PONV Risk Score and Plan: 2 and Propofol infusion and Treatment may vary due to age or medical condition  Airway Management Planned: Nasal Cannula  Additional Equipment:   Intra-op Plan:   Post-operative Plan:   Informed Consent: I have reviewed the patients History and Physical, chart, labs and discussed the procedure including the risks, benefits and alternatives for the proposed anesthesia with the patient or authorized representative who has indicated his/her understanding and acceptance.     Dental advisory given  Plan Discussed with: CRNA and Surgeon  Anesthesia Plan Comments: (PAT note by Antionette Poles, PA-C: 72 year old female follows with cardiology for history of TN and PSVT which is currently well-controlled on  metoprolol and as needed vagal maneuvers.  Stress testing 12/2022 was nonischemic.  Echo 12/2022 showed EF 56%, possible PFO with right-to-left shunting, mild tricuspid regurgitation.  She was last seen by Dr. Rosemary Holms 08/27/2023 for preop evaluation.  Per note, "No anginal symptoms, normal stress test in 12/2022. Low perioperative cardiac risk for upcoming knee surgery."  Other pertinent history includes prediabetes A1c 6.1 on 09/07/2023), migraines, hypothyroidism s/p RAI ablation x 2 for Graves' disease.  Preop labs reviewed, mild anemia with hemoglobin 9.2, otherwise unremarkable.  EKG 08/27/2023: Sinus bradycardia.  Rate 57.  7-day event monitor 01/13/2023: Dominant rhythm: Sinus. HR 51-147 bpm. Avg HR 76 bpm, in sinus rhythm 3 episodes of SVT, fastest and at 176 bpm for 1 min 9 secs. 1.6% isolated SVE, <1% couplet/triplets. 0 episodes of VT. <1% isolated VE, couplets. No atrial fibrillation/atrial flutter/VT/high grade AV block, sinus pause >3sec noted. 3 patient triggered events, correlated with SVE, VE, SVT.  Lexiscan Tetrofosmin stress test 01/12/2023: Lexiscan nuclear stress test performed using 1-day protocol. Normal myocardial perfusion. Stress LVEF 60%. Low risk study.  Echocardiogram 01/06/2023:  Left ventricle cavity is normal in size and wall thickness. Normal global  wall motion. Normal LV systolic function with EF 56%. Doppler evidence of  grade I (impaired) diastolic dysfunction, normal LAP.  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  Mild tricuspid regurgitation. Estimated pulmonary artery systolic pressure  26 mmHg.   )        Anesthesia Quick Evaluation

## 2023-09-16 ENCOUNTER — Ambulatory Visit (HOSPITAL_COMMUNITY): Payer: Medicare HMO | Admitting: Anesthesiology

## 2023-09-16 ENCOUNTER — Other Ambulatory Visit: Payer: Self-pay

## 2023-09-16 ENCOUNTER — Encounter (HOSPITAL_COMMUNITY)
Admission: RE | Disposition: A | Discharge status: Home / Self Care | Payer: Self-pay | Source: Home / Self Care | Attending: Orthopedic Surgery

## 2023-09-16 ENCOUNTER — Encounter (HOSPITAL_COMMUNITY): Payer: Self-pay | Admitting: Orthopedic Surgery

## 2023-09-16 ENCOUNTER — Ambulatory Visit (HOSPITAL_COMMUNITY): Payer: Medicare HMO | Admitting: Physician Assistant

## 2023-09-16 ENCOUNTER — Ambulatory Visit (HOSPITAL_COMMUNITY)
Admission: RE | Admit: 2023-09-16 | Discharge: 2023-09-18 | Disposition: A | Discharge status: Home / Self Care | Payer: Medicare HMO | Attending: Orthopedic Surgery | Admitting: Orthopedic Surgery

## 2023-09-16 DIAGNOSIS — I10 Essential (primary) hypertension: Secondary | ICD-10-CM | POA: Insufficient documentation

## 2023-09-16 DIAGNOSIS — K219 Gastro-esophageal reflux disease without esophagitis: Secondary | ICD-10-CM | POA: Insufficient documentation

## 2023-09-16 DIAGNOSIS — E0500 Thyrotoxicosis with diffuse goiter without thyrotoxic crisis or storm: Secondary | ICD-10-CM | POA: Insufficient documentation

## 2023-09-16 DIAGNOSIS — M1712 Unilateral primary osteoarthritis, left knee: Secondary | ICD-10-CM

## 2023-09-16 DIAGNOSIS — Z96652 Presence of left artificial knee joint: Secondary | ICD-10-CM

## 2023-09-16 DIAGNOSIS — E119 Type 2 diabetes mellitus without complications: Secondary | ICD-10-CM | POA: Diagnosis not present

## 2023-09-16 HISTORY — DX: Prediabetes: R73.03

## 2023-09-16 HISTORY — PX: TOTAL KNEE ARTHROPLASTY: SHX125

## 2023-09-16 LAB — GLUCOSE, CAPILLARY: Glucose-Capillary: 105 mg/dL — ABNORMAL HIGH (ref 70–99)

## 2023-09-16 SURGERY — ARTHROPLASTY, KNEE, TOTAL
Anesthesia: Monitor Anesthesia Care | Site: Knee | Laterality: Left

## 2023-09-16 MED ORDER — OXYCODONE HCL 5 MG PO TABS
5.0000 mg | ORAL_TABLET | ORAL | Status: DC | PRN
  Administered 2023-09-17: 10 mg via ORAL
  Filled 2023-09-16: qty 2

## 2023-09-16 MED ORDER — PHENOL 1.4 % MT LIQD: 1.0000 | OROMUCOSAL | Status: DC | PRN

## 2023-09-16 MED ORDER — CHLORHEXIDINE GLUCONATE 0.12 % MT SOLN
15.0000 mL | Freq: Once | OROMUCOSAL | Status: AC
Start: 2023-09-16 — End: 2023-09-16

## 2023-09-16 MED ORDER — ONDANSETRON HCL 4 MG PO TABS: 4.0000 mg | ORAL_TABLET | Freq: Four times a day (QID) | ORAL | Status: DC | PRN

## 2023-09-16 MED ORDER — ONDANSETRON HCL 4 MG/2ML IJ SOLN
INTRAMUSCULAR | Status: AC
  Filled 2023-09-16: qty 2

## 2023-09-16 MED ORDER — ORAL CARE MOUTH RINSE: 15.0000 mL | Freq: Once | OROMUCOSAL | Status: AC

## 2023-09-16 MED ORDER — ESTRADIOL 0.5 MG PO TABS
0.5000 mg | ORAL_TABLET | Freq: Every day | ORAL | Status: DC
  Administered 2023-09-17 – 2023-09-18 (×2): 0.5 mg via ORAL
  Filled 2023-09-16 (×2): qty 1

## 2023-09-16 MED ORDER — 0.9 % SODIUM CHLORIDE (POUR BTL) OPTIME
TOPICAL | Status: DC | PRN
  Administered 2023-09-16: 1000 mL

## 2023-09-16 MED ORDER — TRANEXAMIC ACID-NACL 1000-0.7 MG/100ML-% IV SOLN
1000.0000 mg | INTRAVENOUS | Status: AC
  Administered 2023-09-16: 1000 mg via INTRAVENOUS

## 2023-09-16 MED ORDER — METOCLOPRAMIDE HCL 5 MG PO TABS
5.0000 mg | ORAL_TABLET | Freq: Three times a day (TID) | ORAL | Status: DC | PRN
  Filled 2023-09-16: qty 2

## 2023-09-16 MED ORDER — DOCUSATE SODIUM 100 MG PO CAPS
100.0000 mg | ORAL_CAPSULE | Freq: Two times a day (BID) | ORAL | Status: DC
  Administered 2023-09-17 – 2023-09-18 (×3): 100 mg via ORAL
  Filled 2023-09-16 (×4): qty 1

## 2023-09-16 MED ORDER — SODIUM CHLORIDE 0.9 % IV SOLN
INTRAVENOUS | Status: DC | PRN
Start: 2023-09-16 — End: 2023-09-16

## 2023-09-16 MED ORDER — MENTHOL 3 MG MT LOZG: 1.0000 | LOZENGE | OROMUCOSAL | Status: DC | PRN

## 2023-09-16 MED ORDER — FENTANYL CITRATE (PF) 250 MCG/5ML IJ SOLN
INTRAMUSCULAR | Status: DC | PRN
  Administered 2023-09-16: 50 ug via INTRAVENOUS

## 2023-09-16 MED ORDER — EPHEDRINE 5 MG/ML INJ
INTRAVENOUS | Status: AC
  Filled 2023-09-16: qty 5

## 2023-09-16 MED ORDER — OXYCODONE HCL 5 MG PO TABS: 5.0000 mg | ORAL_TABLET | Freq: Once | ORAL | Status: DC | PRN

## 2023-09-16 MED ORDER — HYDROMORPHONE HCL 1 MG/ML IJ SOLN: 0.2500 mg | INTRAMUSCULAR | Status: DC | PRN

## 2023-09-16 MED ORDER — LEVOTHYROXINE SODIUM 75 MCG PO TABS
75.0000 ug | ORAL_TABLET | Freq: Every day | ORAL | Status: DC
  Administered 2023-09-17 – 2023-09-18 (×2): 75 ug via ORAL
  Filled 2023-09-16 (×2): qty 1

## 2023-09-16 MED ORDER — ONDANSETRON HCL 4 MG/2ML IJ SOLN: 4.0000 mg | Freq: Once | INTRAMUSCULAR | Status: DC | PRN

## 2023-09-16 MED ORDER — TRANEXAMIC ACID 1000 MG/10ML IV SOLN
INTRAVENOUS | Status: DC | PRN
  Administered 2023-09-16: 2000 mg via TOPICAL

## 2023-09-16 MED ORDER — ROPIVACAINE HCL 7.5 MG/ML IJ SOLN
INTRAMUSCULAR | Status: DC | PRN
  Administered 2023-09-16: 20 mL via PERINEURAL

## 2023-09-16 MED ORDER — METFORMIN HCL ER 500 MG PO TB24
500.0000 mg | ORAL_TABLET | Freq: Every day | ORAL | Status: DC
  Administered 2023-09-16 – 2023-09-17 (×2): 500 mg via ORAL
  Filled 2023-09-16 (×2): qty 1

## 2023-09-16 MED ORDER — MIDAZOLAM HCL 2 MG/2ML IJ SOLN
INTRAMUSCULAR | Status: DC | PRN
  Administered 2023-09-16: 2 mg via INTRAVENOUS

## 2023-09-16 MED ORDER — PHENYLEPHRINE 80 MCG/ML (10ML) SYRINGE FOR IV PUSH (FOR BLOOD PRESSURE SUPPORT)
PREFILLED_SYRINGE | INTRAVENOUS | Status: DC | PRN
  Administered 2023-09-16 (×4): 80 ug via INTRAVENOUS
  Administered 2023-09-16: 160 ug via INTRAVENOUS
  Administered 2023-09-16: 80 ug via INTRAVENOUS

## 2023-09-16 MED ORDER — GABAPENTIN 300 MG PO CAPS
600.0000 mg | ORAL_CAPSULE | Freq: Every day | ORAL | Status: DC
  Administered 2023-09-16 – 2023-09-17 (×2): 600 mg via ORAL
  Filled 2023-09-16 (×2): qty 2

## 2023-09-16 MED ORDER — AMLODIPINE BESYLATE 10 MG PO TABS
10.0000 mg | ORAL_TABLET | Freq: Every day | ORAL | Status: DC
  Administered 2023-09-17 – 2023-09-18 (×2): 10 mg via ORAL
  Filled 2023-09-16 (×2): qty 1

## 2023-09-16 MED ORDER — TRANEXAMIC ACID 1000 MG/10ML IV SOLN
2000.0000 mg | INTRAVENOUS | Status: DC
  Filled 2023-09-16: qty 20

## 2023-09-16 MED ORDER — OXYCODONE HCL 5 MG PO TABS
10.0000 mg | ORAL_TABLET | ORAL | Status: DC | PRN
  Administered 2023-09-16 – 2023-09-17 (×2): 10 mg via ORAL
  Filled 2023-09-16 (×3): qty 2

## 2023-09-16 MED ORDER — CEFAZOLIN SODIUM-DEXTROSE 2-4 GM/100ML-% IV SOLN
2.0000 g | INTRAVENOUS | Status: AC
  Administered 2023-09-16: 2 g via INTRAVENOUS

## 2023-09-16 MED ORDER — CEFAZOLIN SODIUM-DEXTROSE 2-4 GM/100ML-% IV SOLN
INTRAVENOUS | Status: AC
  Filled 2023-09-16: qty 100

## 2023-09-16 MED ORDER — PANTOPRAZOLE SODIUM 40 MG PO TBEC
40.0000 mg | DELAYED_RELEASE_TABLET | Freq: Every day | ORAL | Status: DC
  Administered 2023-09-17 – 2023-09-18 (×2): 40 mg via ORAL
  Filled 2023-09-16 (×2): qty 1

## 2023-09-16 MED ORDER — CHLORHEXIDINE GLUCONATE 0.12 % MT SOLN
OROMUCOSAL | Status: AC
  Administered 2023-09-16: 15 mL via OROMUCOSAL
  Filled 2023-09-16: qty 15

## 2023-09-16 MED ORDER — ACETAMINOPHEN 325 MG PO TABS
325.0000 mg | ORAL_TABLET | Freq: Four times a day (QID) | ORAL | Status: DC | PRN
Start: 2023-09-17 — End: 2023-09-18
  Administered 2023-09-17 – 2023-09-18 (×2): 650 mg via ORAL
  Filled 2023-09-16 (×6): qty 2

## 2023-09-16 MED ORDER — ONDANSETRON HCL 4 MG/2ML IJ SOLN
INTRAMUSCULAR | Status: DC | PRN
  Administered 2023-09-16: 4 mg via INTRAVENOUS

## 2023-09-16 MED ORDER — PHENYLEPHRINE HCL-NACL 20-0.9 MG/250ML-% IV SOLN
INTRAVENOUS | Status: DC | PRN
  Administered 2023-09-16: 40 ug/min via INTRAVENOUS

## 2023-09-16 MED ORDER — PROPOFOL 10 MG/ML IV BOLUS
INTRAVENOUS | Status: AC
  Filled 2023-09-16: qty 20

## 2023-09-16 MED ORDER — HYDROMORPHONE HCL 1 MG/ML IJ SOLN
INTRAMUSCULAR | Status: AC
  Filled 2023-09-16: qty 1

## 2023-09-16 MED ORDER — ONDANSETRON HCL 4 MG/2ML IJ SOLN
4.0000 mg | Freq: Four times a day (QID) | INTRAMUSCULAR | Status: DC | PRN
  Administered 2023-09-16 – 2023-09-17 (×2): 4 mg via INTRAVENOUS
  Filled 2023-09-16 (×3): qty 2

## 2023-09-16 MED ORDER — LACTATED RINGERS IV SOLN: INTRAVENOUS | Status: DC

## 2023-09-16 MED ORDER — ASPIRIN 325 MG PO TBEC
325.0000 mg | DELAYED_RELEASE_TABLET | Freq: Every day | ORAL | Status: DC
  Administered 2023-09-17 – 2023-09-18 (×2): 325 mg via ORAL
  Filled 2023-09-16 (×2): qty 1

## 2023-09-16 MED ORDER — BUPIVACAINE IN DEXTROSE 0.75-8.25 % IT SOLN
INTRATHECAL | Status: DC | PRN
  Administered 2023-09-16: 1.4 mL via INTRATHECAL

## 2023-09-16 MED ORDER — BISACODYL 10 MG RE SUPP: 10.0000 mg | Freq: Every day | RECTAL | Status: DC | PRN

## 2023-09-16 MED ORDER — METOCLOPRAMIDE HCL 5 MG/ML IJ SOLN
5.0000 mg | Freq: Three times a day (TID) | INTRAMUSCULAR | Status: DC | PRN
  Administered 2023-09-16 – 2023-09-17 (×2): 10 mg via INTRAVENOUS
  Filled 2023-09-16 (×2): qty 2

## 2023-09-16 MED ORDER — SODIUM CHLORIDE 0.9% FLUSH
10.0000 mL | Freq: Two times a day (BID) | INTRAVENOUS | Status: DC
  Administered 2023-09-17 – 2023-09-18 (×3): 10 mL via INTRAVENOUS

## 2023-09-16 MED ORDER — HYDROCHLOROTHIAZIDE 25 MG PO TABS
25.0000 mg | ORAL_TABLET | Freq: Every day | ORAL | Status: DC
  Administered 2023-09-17 – 2023-09-18 (×2): 25 mg via ORAL
  Filled 2023-09-16 (×2): qty 1

## 2023-09-16 MED ORDER — HYDROMORPHONE HCL 1 MG/ML IJ SOLN
0.5000 mg | INTRAMUSCULAR | Status: DC | PRN
  Administered 2023-09-16 – 2023-09-17 (×2): 1 mg via INTRAVENOUS
  Filled 2023-09-16: qty 1

## 2023-09-16 MED ORDER — LOSARTAN POTASSIUM 50 MG PO TABS
100.0000 mg | ORAL_TABLET | Freq: Every day | ORAL | Status: DC
  Administered 2023-09-17 – 2023-09-18 (×2): 100 mg via ORAL
  Filled 2023-09-16 (×2): qty 2

## 2023-09-16 MED ORDER — CEFAZOLIN SODIUM-DEXTROSE 2-4 GM/100ML-% IV SOLN
2.0000 g | Freq: Four times a day (QID) | INTRAVENOUS | Status: AC
  Administered 2023-09-16 (×2): 2 g via INTRAVENOUS
  Filled 2023-09-16 (×2): qty 100

## 2023-09-16 MED ORDER — MAGNESIUM CITRATE PO SOLN: 1.0000 | Freq: Once | ORAL | Status: DC | PRN

## 2023-09-16 MED ORDER — PROPOFOL 500 MG/50ML IV EMUL
INTRAVENOUS | Status: DC | PRN
  Administered 2023-09-16: 75 ug/kg/min via INTRAVENOUS
  Administered 2023-09-16: 25 mg via INTRAVENOUS

## 2023-09-16 MED ORDER — TRANEXAMIC ACID-NACL 1000-0.7 MG/100ML-% IV SOLN
INTRAVENOUS | Status: AC
  Filled 2023-09-16: qty 100

## 2023-09-16 MED ORDER — METOPROLOL SUCCINATE ER 50 MG PO TB24
50.0000 mg | ORAL_TABLET | Freq: Every day | ORAL | Status: DC
  Administered 2023-09-17 – 2023-09-18 (×2): 50 mg via ORAL
  Filled 2023-09-16 (×2): qty 1

## 2023-09-16 MED ORDER — MIDAZOLAM HCL 2 MG/2ML IJ SOLN
INTRAMUSCULAR | Status: AC
  Filled 2023-09-16: qty 2

## 2023-09-16 MED ORDER — POLYETHYLENE GLYCOL 3350 17 G PO PACK: 17.0000 g | PACK | Freq: Every day | ORAL | Status: DC | PRN

## 2023-09-16 MED ORDER — LOSARTAN POTASSIUM-HCTZ 100-25 MG PO TABS: 1.0000 | ORAL_TABLET | Freq: Every day | ORAL | Status: DC

## 2023-09-16 MED ORDER — FENTANYL CITRATE (PF) 250 MCG/5ML IJ SOLN
INTRAMUSCULAR | Status: AC
  Filled 2023-09-16: qty 5

## 2023-09-16 MED ORDER — PHENYLEPHRINE 80 MCG/ML (10ML) SYRINGE FOR IV PUSH (FOR BLOOD PRESSURE SUPPORT)
PREFILLED_SYRINGE | INTRAVENOUS | Status: AC
  Filled 2023-09-16: qty 10

## 2023-09-16 MED ORDER — OXYCODONE HCL 5 MG/5ML PO SOLN
5.0000 mg | Freq: Once | ORAL | Status: DC | PRN
Start: 2023-09-16 — End: 2023-09-16

## 2023-09-16 SURGICAL SUPPLY — 58 items
AUG MED ASF PS 4-5 C-D 10 (Joint) ×1 IMPLANT
AUGMENT MED ASF PS 4-5 C-D 10 (Joint) IMPLANT
BAG COUNTER SPONGE SURGICOUNT (BAG) ×1 IMPLANT
BAG SPNG CNTER NS LX DISP (BAG) ×1
BASEPLATE TIB PS KNEE C 2P LT (Joint) IMPLANT
BLADE SAGITTAL 25.0X1.19X90 (BLADE) ×1 IMPLANT
BLADE SAW SGTL 13X75X1.27 (BLADE) ×1 IMPLANT
BLADE SURG 21 STRL SS (BLADE) ×2 IMPLANT
BNDG CMPR 5X6 CHSV STRCH STRL (GAUZE/BANDAGES/DRESSINGS) ×1
BNDG COHESIVE 6X5 TAN ST LF (GAUZE/BANDAGES/DRESSINGS) ×2 IMPLANT
BNDG GAUZE DERMACEA FLUFF 4 (GAUZE/BANDAGES/DRESSINGS) ×1 IMPLANT
BNDG GZE DERMACEA 4 6PLY (GAUZE/BANDAGES/DRESSINGS) ×1
BOWL SMART MIX CTS (DISPOSABLE) ×1 IMPLANT
BSPLAT TIB PS KNEE C 2P LT STL (Joint) ×1 IMPLANT
COMP FEM PS KNEE NRW 4 LT (Joint) ×1 IMPLANT
COMP PATELLA 3 PEG 29X9 (Joint) ×1 IMPLANT
COMPONENT FEM PS KNEE NRW 4 LT (Joint) IMPLANT
COMPONENT PATELLA 3 PEG 29X9 (Joint) IMPLANT
COOLER ICEMAN CLASSIC (MISCELLANEOUS) ×1 IMPLANT
COVER SURGICAL LIGHT HANDLE (MISCELLANEOUS) ×1 IMPLANT
CUFF TOURN SGL QUICK 34 (TOURNIQUET CUFF) ×1
CUFF TOURN SGL QUICK 42 (TOURNIQUET CUFF) IMPLANT
CUFF TRNQT CYL 34X4.125X (TOURNIQUET CUFF) ×1 IMPLANT
DRAPE EXTREMITY T 121X128X90 (DISPOSABLE) ×1 IMPLANT
DRAPE HALF SHEET 40X57 (DRAPES) ×2 IMPLANT
DRAPE U-SHAPE 47X51 STRL (DRAPES) ×1 IMPLANT
DRSG ADAPTIC 3X8 NADH LF (GAUZE/BANDAGES/DRESSINGS) ×1 IMPLANT
DURAPREP 26ML APPLICATOR (WOUND CARE) ×1 IMPLANT
ELECT REM PT RETURN 9FT ADLT (ELECTROSURGICAL) ×1
ELECTRODE REM PT RTRN 9FT ADLT (ELECTROSURGICAL) ×1 IMPLANT
FACESHIELD WRAPAROUND (MASK) ×1 IMPLANT
FACESHIELD WRAPAROUND OR TEAM (MASK) ×1 IMPLANT
GAUZE PAD ABD 8X10 STRL (GAUZE/BANDAGES/DRESSINGS) ×1 IMPLANT
GAUZE SPONGE 4X4 12PLY STRL (GAUZE/BANDAGES/DRESSINGS) ×1 IMPLANT
GLOVE BIOGEL PI IND STRL 9 (GLOVE) ×1 IMPLANT
GLOVE SURG ORTHO 9.0 STRL STRW (GLOVE) ×1 IMPLANT
GOWN STRL REUS W/ TWL XL LVL3 (GOWN DISPOSABLE) ×2 IMPLANT
GOWN STRL REUS W/TWL XL LVL3 (GOWN DISPOSABLE) ×2
HANDPIECE INTERPULSE COAX TIP (DISPOSABLE)
KIT BASIN OR (CUSTOM PROCEDURE TRAY) ×1 IMPLANT
KIT TURNOVER KIT B (KITS) ×1 IMPLANT
MANIFOLD NEPTUNE II (INSTRUMENTS) ×1 IMPLANT
NS IRRIG 1000ML POUR BTL (IV SOLUTION) ×1 IMPLANT
PACK TOTAL JOINT (CUSTOM PROCEDURE TRAY) ×1 IMPLANT
PAD ARMBOARD 7.5X6 YLW CONV (MISCELLANEOUS) ×1 IMPLANT
PAD COLD SHLDR WRAP-ON (PAD) ×1 IMPLANT
PIN DRILL HDLS TROCAR 75 4PK (PIN) IMPLANT
SCREW FEMALE HEX FIX 25X2.5 (ORTHOPEDIC DISPOSABLE SUPPLIES) IMPLANT
SET HNDPC FAN SPRY TIP SCT (DISPOSABLE) ×1 IMPLANT
STAPLER VISISTAT 35W (STAPLE) ×1 IMPLANT
SUCTION TUBE FRAZIER 10FR DISP (SUCTIONS) IMPLANT
SUT VIC AB 0 CT1 27 (SUTURE) ×2
SUT VIC AB 0 CT1 27XBRD ANBCTR (SUTURE) ×1 IMPLANT
SUT VIC AB 1 CTX 36 (SUTURE) ×1
SUT VIC AB 1 CTX36XBRD ANBCTR (SUTURE) IMPLANT
SYR 20CC LL (SYRINGE) IMPLANT
TOWEL GREEN STERILE (TOWEL DISPOSABLE) ×1 IMPLANT
TOWEL GREEN STERILE FF (TOWEL DISPOSABLE) ×1 IMPLANT

## 2023-09-16 NOTE — Anesthesia Procedure Notes (Signed)
Anesthesia Procedure Image       

## 2023-09-16 NOTE — Discharge Instructions (Signed)

## 2023-09-16 NOTE — H&P (Signed)
TOTAL KNEE ADMISSION H&P  Patient is being admitted for left total knee arthroplasty.  Subjective:  Chief Complaint:left knee pain.  HPI: Denise Ferguson, 72 y.o. female, has a history of pain and functional disability in the left knee due to arthritis and has failed non-surgical conservative treatments for greater than 12 weeks to includeNSAID's and/or analgesics, corticosteriod injections, use of assistive devices, weight reduction as appropriate, and activity modification.  Onset of symptoms was gradual, starting 8 years ago with gradually worsening course since that time. The patient noted no past surgery on the left knee(s).  Patient currently rates pain in the left knee(s) at 8 out of 10 with activity. Patient has night pain, worsening of pain with activity and weight bearing, pain that interferes with activities of daily living, pain with passive range of motion, crepitus, and joint swelling.  Patient has evidence of subchondral sclerosis, periarticular osteophytes, and joint space narrowing by imaging studies. This patient has had avascular necrosis of the knee. There is no active infection.  Patient Active Problem List   Diagnosis Date Noted   Preop cardiovascular exam 08/27/2023   Retrosternal discomfort 05/01/2023   PSVT (paroxysmal supraventricular tachycardia) (HCC) 01/29/2023   History of meniscal tear    History of colon polyps 09/23/2011   GERD (gastroesophageal reflux disease) 09/23/2011   Graves' disease 09/23/2011   HTN (hypertension) 09/23/2011   Migraines 09/23/2011   Anemia 09/23/2011   Past Medical History:  Diagnosis Date   Allergy    Anemia    Arthritis    knees   Diabetes mellitus without complication (HCC)    " Pre" DM- on Metformin    FHx: migraine headaches    GERD (gastroesophageal reflux disease)    Grave's disease    History of colon polyps    Hx of migraines    Hypertension    Hyperthyroidism    IBS (irritable bowel syndrome)    PONV  (postoperative nausea and vomiting)    Pre-diabetes    PSVT (paroxysmal supraventricular tachycardia) (HCC)     Past Surgical History:  Procedure Laterality Date   ABDOMINAL HYSTERECTOMY     BREAST BIOPSY Left    CARPAL TUNNEL RELEASE  2000   bilateral   CATARACT EXTRACTION Bilateral 03/20/2021   COLONOSCOPY     KNEE ARTHROSCOPY WITH LATERAL MENISECTOMY  07/16/2021   Procedure: KNEE ARTHROSCOPY WITH LATERAL MENISECTOMY;  Surgeon: Nadara Mustard, MD;  Location: [facility];  Service: Orthopedics;;   KNEE ARTHROSCOPY WITH MEDIAL MENISECTOMY Right 07/16/2021   Procedure: ARTHROSCOPIC DEBRIDEMENT RIGHT KNEE, PARTIAL MEDIAL AND LATERAL MENISECTOMIES;  Surgeon: Nadara Mustard, MD;  Location: [facility];  Service: Orthopedics;  Laterality: Right;   POLYPECTOMY     thyroid ablation     x2   TONSILLECTOMY     TUBAL LIGATION  1980    Current Facility-Administered Medications  Medication Dose Route Frequency Provider Last Rate Last Admin   ceFAZolin (ANCEF) 2-4 GM/100ML-% IVPB            ceFAZolin (ANCEF) IVPB 2g/100 mL premix  2 g Intravenous On Call to OR Nadara Mustard, MD       lactated ringers infusion   Intravenous Continuous Marcene Duos, MD       tranexamic acid (CYKLOKAPRON) 1000MG /182mL IVPB            tranexamic acid (CYKLOKAPRON) 2,000 mg in sodium chloride 0.9 % 50 mL Topical Application  2,000 mg Topical To OR Nadara Mustard,  MD       tranexamic acid (CYKLOKAPRON) IVPB 1,000 mg  1,000 mg Intravenous To OR Nadara Mustard, MD       Allergies  Allergen Reactions   Black Cohosh Anaphylaxis   Hydrocodone-Acetaminophen     Other reaction(s): GI Upset (intolerance) Other reaction(s): GI Upset (intolerance) Other reaction(s): GI Upset (intolerance)    Latex Hives   Molds & Smuts     Other reaction(s): Other (See Comments) Sneezing   Pollen Extract     Other reaction(s): Other (See Comments) Stuffy nose   Soma [Carisoprodol] Nausea And  Vomiting and Swelling   Tramadol Nausea And Vomiting and Rash    Social History   Tobacco Use   Smoking status: Never   Smokeless tobacco: Never  Substance Use Topics   Alcohol use: No    Alcohol/week: 0.0 standard drinks of alcohol    Family History  Problem Relation Age of Onset   Hypertension Mother    Diabetes Father    Hypertension Father    Diabetes Brother    Lung cancer Other    Alcohol abuse Other        uncle   Cancer Other        uncle   Colon cancer Neg Hx    Colon polyps Neg Hx    Esophageal cancer Neg Hx    Rectal cancer Neg Hx    Stomach cancer Neg Hx      Review of Systems  All other systems reviewed and are negative.   Objective:  Physical Exam  Vital signs in last 24 hours: Temp:  [97.6 F (36.4 C)] 97.6 F (36.4 C) (10/23 0600) Pulse Rate:  [65] 65 (10/23 0600) Resp:  [14] 14 (10/23 0600) BP: (153)/(59) 153/59 (10/23 0600) SpO2:  [97 %] 97 % (10/23 0600) Weight:  [86.5 kg] 86.5 kg (10/23 0600)  Labs:   Estimated body mass index is 35.16 kg/m as calculated from the following:   Height as of this encounter: 5' 1.75" (1.568 m).   Weight as of this encounter: 86.5 kg.   Imaging Review Plain radiographs demonstrate moderate degenerative joint disease of the left knee(s). The overall alignment ismild varus. The bone quality appears to be adequate for age and reported activity level.      Assessment/Plan:  End stage arthritis, left knee   The patient history, physical examination, clinical judgment of the provider and imaging studies are consistent with end stage degenerative joint disease of the left knee(s) and total knee arthroplasty is deemed medically necessary. The treatment options including medical management, injection therapy arthroscopy and arthroplasty were discussed at length. The risks and benefits of total knee arthroplasty were presented and reviewed. The risks due to aseptic loosening, infection, stiffness, patella  tracking problems, thromboembolic complications and other imponderables were discussed. The patient acknowledged the explanation, agreed to proceed with the plan and consent was signed. Patient is being admitted for inpatient treatment for surgery, pain control, PT, OT, prophylactic antibiotics, VTE prophylaxis, progressive ambulation and ADL's and discharge planning. The patient is planning to be discharged home with home health services     Patient's anticipated LOS is less than 2 midnights, meeting these requirements: - Younger than 42 - Lives within 1 hour of care - Has a competent adult at home to recover with post-op recover - NO history of  - Chronic pain requiring opiods  - Diabetes  - Coronary Artery Disease  - Heart failure  - Heart attack  -  Stroke  - DVT/VTE  - Cardiac arrhythmia  - Respiratory Failure/COPD  - Renal failure  - Anemia  - Advanced Liver disease

## 2023-09-16 NOTE — Op Note (Signed)
DATE OF SURGERY:  09/16/2023  TIME: 9:14 AM  PATIENT NAME:  Denise Ferguson    AGE: 72 y.o.    PRE-OPERATIVE DIAGNOSIS:  Osteoarthritis Left Knee  POST-OPERATIVE DIAGNOSIS:  Osteoarthritis Left Knee  PROCEDURE:  Procedure(s): LEFT TOTAL KNEE ARTHROPLASTY  SURGEON: Aldean Baker  ASSISTANT: Chilton Si  OPERATIVE IMPLANTS: Zimmer Persona  Femur size 4, Tibia size C  2- Peg fixed bearing, Patella size 29 3-peg oval button, with a 10 mm polyethylene insert medial congruent.  @ENCIMAGES @  Implant Name Type Inv. Item Serial No. Manufacturer Lot No. LRB No. Used Action  COMP PATELLA 3 PEG 29X9 - [id number] Joint COMP PATELLA 3 PEG 29X9  ZIMMER RECON(ORTH,TRAU,BIO,SG) [id number] Left 1 Implanted  AUG MED ASF PS 4-5 C-D 10 - [id number] Joint AUG MED ASF PS 4-5 C-D 10  ZIMMER RECON(ORTH,TRAU,BIO,SG) [id number] Left 1 Implanted  Zimmer Femur Left 4 Narrow    ZIMMER KNEE [id number] Left 1 Implanted  Zimmer Tibia Left Size C    ZIMMER KNEE [id number] Left 1 Implanted      PREOPERATIVE INDICATIONS:   Denise Ferguson is a 72 y.o. year old female with end stage degenerative arthritis of the knee who failed conservative treatment and elected for Total Knee Arthroplasty.   The risks, benefits, and alternatives were discussed at length including but not limited to the risks of infection, bleeding, nerve injury, stiffness, blood clots, the need for revision surgery, cardiopulmonary complications, among others, and they were willing to proceed.  OPERATIVE DESCRIPTION:  The patient was brought to the operative room and placed in a supine position.  Anesthesia was administered.  IV antibiotics were given.  IV TXA was initiated.  The lower extremity was prepped and draped in the usual sterile fashion.  Collier Flowers was used to cover all exposed skin. Time out was performed.    Anterior quadriceps tendon splitting approach was performed.  The patella was everted and osteophytes were removed.  The anterior horn of the  medial and lateral meniscus was removed.   The distal femur was opened with the drill and the intramedullary distal femoral cutting jig was utilized, set at 5 degrees valgus resecting 9 mm off the distal femur.  Care was taken to protect the collateral ligaments.  Then the extramedullary tibial cutting jig was utilized set for 3 degree posterior slope.  Care was taken during the cut to protect the medial and collateral ligaments.  The proximal tibia was removed along with the posterior horns of the menisci.  The PCL was sacrificed.    The extensor gap was measured and was approximately 10 mm.    The distal femoral sizing jig was applied, taking care to avoid notching.  Then the 4-in-1 cutting jig was applied and the anterior and posterior femur was cut, along with the chamfer cuts.  All posterior osteophytes were removed.  The flexion gap was then measured and was symmetric with the extension gap.  The distal femoral preparation using the appropriate jig to prepare the box.  The patella was then measured, and cut with the saw.    The proximal tibia sized and prepared accordingly with the reamer and the punch, and then all components were trialed with the poly insert.  The knee was found to have stable balance and full motion.  The knee was irrigated with normal saline, topical 2 g TXA was used to soak the wound.  The above named components were then press fit into place.  The final polyethylene component was placed.  The  knee was then taken through a range of motion and the patella tracked well and the knee irrigated copiously and the parapatellar and subcutaneous tissue closed with vicryl, and skin closed with staples..  A sterile dressing was applied and patient  was taken to the PACU in stable  condition.  There were no complications.  Total tourniquet time was 16 minutes.

## 2023-09-16 NOTE — Progress Notes (Signed)
PT Cancellation Note  Patient Details Name: Denise Ferguson MRN: [id number] DOB: [birth year]   Cancelled Treatment:    Reason Eval/Treat Not Completed: Medical issues which prohibited therapy. Pt reporting significant nausea, declining evaluation at this time. PT attempts to encourage the pt to eat some food in an effort to settle her stomach as zofran was already provided but ineffective. PT will attempt to follow up as time allows.   Arlyss Gandy 09/16/2023, 2:31 PM

## 2023-09-16 NOTE — Plan of Care (Signed)
  Problem: Education: Goal: Knowledge of General Education information will improve Description: Including pain rating scale, medication(s)/side effects and non-pharmacologic comfort measures Outcome: Progressing   Problem: Clinical Measurements: Goal: Will remain free from infection Outcome: Progressing   Problem: Activity: Goal: Risk for activity intolerance will decrease Outcome: Progressing   Problem: Nutrition: Goal: Adequate nutrition will be maintained Outcome: Progressing   Problem: Coping: Goal: Level of anxiety will decrease Outcome: Progressing   Problem: Elimination: Goal: Will not experience complications related to bowel motility Outcome: Progressing   Problem: Pain Management: Goal: General experience of comfort will improve Outcome: Progressing

## 2023-09-16 NOTE — Transfer of Care (Signed)
Immediate Anesthesia Transfer of Care Note  Patient: Denise Ferguson  Procedure(s) Performed: LEFT TOTAL KNEE ARTHROPLASTY (Left: Knee)  Patient Location: PACU  Anesthesia Type:MAC, Regional, and Spinal  Level of Consciousness: awake, drowsy, and patient cooperative  Airway & Oxygen Therapy: Patient Spontanous Breathing  Post-op Assessment: Report given to RN and Post -op Vital signs reviewed and stable  Post vital signs: Reviewed and stable  Last Vitals:  Vitals Value Taken Time  BP 87/47 09/16/23 0903  Temp    Pulse 71 09/16/23 0907  Resp 19 09/16/23 0907  SpO2 91 % 09/16/23 0907  Vitals shown include unfiled device data.  Last Pain:  Vitals:   09/16/23 0600  TempSrc: Oral  PainSc: 0-No pain         Complications: No notable events documented.

## 2023-09-16 NOTE — Evaluation (Signed)
Physical Therapy Evaluation Patient Details Name: Denise Ferguson MRN: [id number] DOB: 1951-09-15 Today's Date: 09/16/2023  History of Present Illness  72 y.o. female presents to [facility] hospital on 09/16/2023 for elective L TKA. PMH includes HTN, anemia, grave's disease, GERD, DM, and arthritis  Clinical Impression  Pt presents to physical therapy with deficits in strength, mobility, and ROM in the LLE. The pt required assist with LLE for bed mobility.The pt was able to stand bedside using a RW, but was unable to progress to ambulation due to increased nausea. PT provided education on TKA exercises and importance of knee extension when resting. The pt will continue to benefit from skilled PT to address remaining deficits.      If plan is discharge home, recommend the following: A lot of help with walking and/or transfers;A little help with bathing/dressing/bathroom;Assistance with cooking/housework;Assist for transportation   Can travel by private vehicle        Equipment Recommendations Rolling walker (2 wheels)  Recommendations for Other Services       Functional Status Assessment Patient has had a recent decline in their functional status and demonstrates the ability to make significant improvements in function in a reasonable and predictable amount of time.     Precautions / Restrictions Precautions Precautions: Knee;Fall Precaution Booklet Issued: Yes (comment) (reviewed TKA exercise packet) Restrictions Weight Bearing Restrictions: Yes LLE Weight Bearing: Weight bearing as tolerated      Mobility  Bed Mobility Overal bed mobility: Needs Assistance Bed Mobility: Supine to Sit, Sit to Supine     Supine to sit: Min assist Sit to supine: Min assist   General bed mobility comments: Needed assistance with LLE    Transfers Overall transfer level: Needs assistance Equipment used: Rolling walker (2 wheels) Transfers: Sit to/from Stand Sit to Stand: Contact guard assist                 Ambulation/Gait                  Stairs            Wheelchair Mobility     Tilt Bed    Modified Rankin (Stroke Patients Only)       Balance Overall balance assessment: Needs assistance Sitting-balance support: Bilateral upper extremity supported, Feet supported Sitting balance-Leahy Scale: Poor     Standing balance support: Bilateral upper extremity supported Standing balance-Leahy Scale: Poor                               Pertinent Vitals/Pain Pain Assessment Pain Assessment: 0-10 Pain Score: 10-Worst pain ever Pain Location: L knee Pain Descriptors / Indicators: Grimacing Pain Intervention(s): Monitored during session    Home Living Family/patient expects to be discharged to:: Private residence Living Arrangements: Alone Available Help at Discharge: Family Type of Home: Apartment Home Access: Level entry       Home Layout: One level Home Equipment: None      Prior Function Prior Level of Function : Independent/Modified Independent                     Extremity/Trunk Assessment   Upper Extremity Assessment Upper Extremity Assessment: Overall WFL for tasks assessed    Lower Extremity Assessment Lower Extremity Assessment: LLE deficits/detail LLE Deficits / Details: Post-op ROM and strength deficits as expected POD 0. LLE: Unable to fully assess due to pain LLE Sensation: WNL LLE Coordination: WNL  Cervical / Trunk Assessment Cervical / Trunk Assessment: Normal  Communication   Communication Communication: No apparent difficulties Cueing Techniques: Verbal cues;Tactile cues;Visual cues  Cognition Arousal: Alert Behavior During Therapy: WFL for tasks assessed/performed Overall Cognitive Status: Within Functional Limits for tasks assessed                                          General Comments General comments (skin integrity, edema, etc.): VSS    Exercises      Assessment/Plan    PT Assessment Patient needs continued PT services  PT Problem List Decreased strength;Decreased range of motion;Decreased activity tolerance;Decreased balance;Decreased mobility;Pain       PT Treatment Interventions Gait training;Stair training;Functional mobility training;Therapeutic activities;Therapeutic exercise;Balance training    PT Goals (Current goals can be found in the Care Plan section)  Acute Rehab PT Goals Patient Stated Goal: return home PT Goal Formulation: With patient Time For Goal Achievement: 09/22/23 Potential to Achieve Goals: Good    Frequency Min 1X/week     Co-evaluation               AM-PAC PT "6 Clicks" Mobility  Outcome Measure Help needed turning from your back to your side while in a flat bed without using bedrails?: A Little Help needed moving from lying on your back to sitting on the side of a flat bed without using bedrails?: A Little Help needed moving to and from a bed to a chair (including a wheelchair)?: A Little Help needed standing up from a chair using your arms (e.g., wheelchair or bedside chair)?: A Little Help needed to walk in hospital room?: A Little Help needed climbing 3-5 steps with a railing? : A Lot 6 Click Score: 17    End of Session Equipment Utilized During Treatment: Gait belt Activity Tolerance: Patient limited by pain (limited by pain and nausea) Patient left: in bed;with call bell/phone within reach;with bed alarm set Nurse Communication: Mobility status PT Visit Diagnosis: Unsteadiness on feet (R26.81);Other abnormalities of gait and mobility (R26.89);Muscle weakness (generalized) (M62.81);Pain Pain - Right/Left: Left Pain - part of body: Knee    Time: 1610-9604 PT Time Calculation (min) (ACUTE ONLY): 26 min   Charges:   PT Evaluation $PT Eval Low Complexity: 1 Low   PT General Charges $$ ACUTE PT VISIT: 1 Visit         Caryl Comes, SPT Acute rehab Office:  [phone]  Caryl Comes 09/16/2023, 5:06 PM

## 2023-09-16 NOTE — Anesthesia Procedure Notes (Signed)
Anesthesia Regional Block: Adductor canal block   Pre-Anesthetic Checklist: , timeout performed,  Correct Patient, Correct Site, Correct Laterality,  Correct Procedure, Correct Position, site marked,  Risks and benefits discussed,  Surgical consent,  Pre-op evaluation,  At surgeon's request and post-op pain management  Laterality: Left  Prep: chloraprep       Needles:  Injection technique: Single-shot  Needle Type: Echogenic Needle     Needle Length: 9cm      Additional Needles:   Procedures:,,,, ultrasound used (permanent image in chart),,    Narrative:  Start time: 09/16/2023 7:08 AM End time: 09/16/2023 7:14 AM Injection made incrementally with aspirations every 5 mL.  Performed by: Personally  Anesthesiologist: Eilene Ghazi, MD  Additional Notes: Patient tolerated the procedure well without complications

## 2023-09-16 NOTE — Anesthesia Postprocedure Evaluation (Signed)
Anesthesia Post Note  Patient: Mckynley Nitzel  Procedure(s) Performed: LEFT TOTAL KNEE ARTHROPLASTY (Left: Knee)     Patient location during evaluation: PACU Anesthesia Type: Spinal Level of consciousness: oriented and awake and alert Pain management: pain level controlled Vital Signs Assessment: post-procedure vital signs reviewed and stable Respiratory status: spontaneous breathing, respiratory function stable and patient connected to nasal cannula oxygen Cardiovascular status: blood pressure returned to baseline and stable Postop Assessment: no headache, no backache and no apparent nausea or vomiting Anesthetic complications: no  No notable events documented.  Last Vitals:  Vitals:   09/16/23 1030 09/16/23 1045  BP: (!) 116/57   Pulse: (!) 57 (!) 57  Resp: (!) 9 13  Temp:    SpO2: 98% 97%    Last Pain:  Vitals:   09/16/23 1030  TempSrc:   PainSc: 0-No pain    LLE Motor Response: Purposeful movement (09/16/23 1045) LLE Sensation: Full sensation (09/16/23 1045) RLE Motor Response: Purposeful movement (09/16/23 1045) RLE Sensation: Full sensation (09/16/23 1045) L Sensory Level: S1-Sole of foot, small toes (09/16/23 1045) R Sensory Level: S1-Sole of foot, small toes (09/16/23 1045)  Christopherjohn Schiele S

## 2023-09-16 NOTE — Anesthesia Procedure Notes (Signed)
Spinal  Patient location during procedure: OR Start time: 09/16/2023 7:38 AM End time: 09/16/2023 7:43 AM Reason for block: surgical anesthesia Staffing Performed: anesthesiologist  Anesthesiologist: Eilene Ghazi, MD Performed by: Eilene Ghazi, MD Authorized by: Eilene Ghazi, MD   Preanesthetic Checklist Completed: patient identified, IV checked, site marked, risks and benefits discussed, surgical consent, monitors and equipment checked, pre-op evaluation and timeout performed Spinal Block Patient position: sitting Prep: Betadine Patient monitoring: heart rate, continuous pulse ox and blood pressure Approach: midline Location: L3-4 Injection technique: single-shot Needle Needle type: Sprotte  Needle gauge: 24 G Needle length: 9 cm Assessment Sensory level: T6 Events: CSF return Additional Notes

## 2023-09-17 DIAGNOSIS — M1712 Unilateral primary osteoarthritis, left knee: Secondary | ICD-10-CM | POA: Diagnosis not present

## 2023-09-17 MED ORDER — OXYCODONE-ACETAMINOPHEN 5-325 MG PO TABS: 1.0000 | ORAL_TABLET | ORAL | 0 refills | Status: DC | PRN

## 2023-09-17 NOTE — Progress Notes (Signed)
Physical Therapy Treatment Patient Details Name: Denise Ferguson MRN: [id number] DOB: 04/09/1951 Today's Date: 09/17/2023   History of Present Illness 72 y.o. female presents to [facility] on 09/16/2023 for elective L TKA. PMH includes HTN, anemia, grave's disease, GERD, DM, and arthritis    PT Comments  Pt is progressing towards goals. Pt has much improvement from this morning. Pt at supervision for sit to stand with AD, Min A for sitting to supine and CGA for increased gait to 75 ft with RW. Pt cleared from a physical therapist perspective for home with intermittent assistance on discharge from acute care hospital setting once medically stable. Will continue to follow up as able and appropriate.     If plan is discharge home, recommend the following: Assistance with cooking/housework;Assist for transportation;A little help with walking and/or transfers     Equipment Recommendations  Rolling walker (2 wheels)       Precautions / Restrictions Precautions Precautions: Knee;Fall Precaution Booklet Issued: No Restrictions Weight Bearing Restrictions: Yes LLE Weight Bearing: Weight bearing as tolerated     Mobility  Bed Mobility Overal bed mobility: Needs Assistance Bed Mobility: Sit to Supine     Supine to sit: Contact guard Sit to supine: Min assist   General bed mobility comments: with increased time Min A with LLE    Transfers Overall transfer level: Needs assistance Equipment used: Rolling walker (2 wheels) Transfers: Sit to/from Stand, Bed to chair/wheelchair/BSC Sit to Stand: Supervision   Step pivot transfers: Contact guard assist       General transfer comment: supervision for safety, no dizziness reported this session    Ambulation/Gait Ambulation/Gait assistance: Contact guard assist Gait Distance (Feet): 75 Feet Assistive device: Rolling walker (2 wheels) Gait Pattern/deviations: Step-through pattern, Decreased dorsiflexion - left, Decreased step length  - right, Decreased stance time - left Gait velocity: decreased Gait velocity interpretation: <1.31 ft/sec, indicative of household ambulator   General Gait Details: improved gait this session with consistent partial step through gait pattern. 1x verbal cues for correct sequencing with RW.       Balance Overall balance assessment: Needs assistance Sitting-balance support: No upper extremity supported, Single extremity supported, Bilateral upper extremity supported Sitting balance-Leahy Scale: Good     Standing balance support: Bilateral upper extremity supported, Reliant on assistive device for balance Standing balance-Leahy Scale: Fair      Cognition Arousal: Alert Behavior During Therapy: WFL for tasks assessed/performed Overall Cognitive Status: Within Functional Limits for tasks assessed     Exercises Total Joint Exercises Goniometric ROM: ~40 degrees flexion ~(-10) degrees extension    General Comments General comments (skin integrity, edema, etc.): Significant improvement from this morning      Pertinent Vitals/Pain Pain Assessment Pain Assessment: Faces Pain Score: 10-Worst pain ever Faces Pain Scale: Hurts little more Pain Location: L knee Pain Descriptors / Indicators: Grimacing Pain Intervention(s): Monitored during session, Premedicated before session     PT Goals (current goals can now be found in the care plan section) Acute Rehab PT Goals Patient Stated Goal: return home PT Goal Formulation: With patient Time For Goal Achievement: 09/22/23 Potential to Achieve Goals: Good Progress towards PT goals: Progressing toward goals    Frequency    Min 1X/week      PT Plan  Continue with current POC       AM-PAC PT "6 Clicks" Mobility   Outcome Measure  Help needed turning from your back to your side while in a flat bed without using bedrails?: A  Little Help needed moving from lying on your back to sitting on the side of a flat bed without using  bedrails?: A Little Help needed moving to and from a bed to a chair (including a wheelchair)?: A Little Help needed standing up from a chair using your arms (e.g., wheelchair or bedside chair)?: A Little Help needed to walk in hospital room?: A Little Help needed climbing 3-5 steps with a railing? : A Lot 6 Click Score: 17    End of Session Equipment Utilized During Treatment: Gait belt Activity Tolerance: Patient tolerated treatment well Patient left: in bed;with call bell/phone within reach;with family/visitor present Nurse Communication: Mobility status PT Visit Diagnosis: Unsteadiness on feet (R26.81);Other abnormalities of gait and mobility (R26.89);Muscle weakness (generalized) (M62.81);Pain Pain - Right/Left: Right Pain - part of body: Knee     Time: 1430-1447 PT Time Calculation (min) (ACUTE ONLY): 17 min  Charges:    $Gait Training: 8-22 mins PT General Charges $$ ACUTE PT VISIT: 1 Visit                     Harrel Carina, DPT, CLT  Acute Rehabilitation Services Office: [phone] (Secure chat preferred)    [name] 09/17/2023, 2:51 PM

## 2023-09-17 NOTE — Plan of Care (Signed)
  Problem: Education: Goal: Knowledge of General Education information will improve Description: Including pain rating scale, medication(s)/side effects and non-pharmacologic comfort measures Outcome: Progressing   Problem: Health Behavior/Discharge Planning: Goal: Ability to manage health-related needs will improve Outcome: Progressing   Problem: Clinical Measurements: Goal: Ability to maintain clinical measurements within normal limits will improve Outcome: Progressing   Problem: Activity: Goal: Risk for activity intolerance will decrease Outcome: Progressing   Problem: Pain Management: Goal: General experience of comfort will improve Outcome: Progressing   Problem: Safety: Goal: Ability to remain free from injury will improve Outcome: Progressing   Problem: Skin Integrity: Goal: Risk for impaired skin integrity will decrease Outcome: Progressing

## 2023-09-17 NOTE — Discharge Summary (Signed)
Physician Discharge Summary  Patient ID: Denise Ferguson MRN: [id number] DOB/AGE: August 16, 1951 72 y.o.  Admit date: 09/16/2023 Discharge date: 09/17/2023  Admission Diagnoses:  Principal Problem:   Total knee replacement status, left Active Problems:   Primary osteoarthritis of left knee   Discharge Diagnoses:  Same  Past Medical History:  Diagnosis Date   Allergy    Anemia    Arthritis    knees   Diabetes mellitus without complication (HCC)    " Pre" DM- on Metformin    FHx: migraine headaches    GERD (gastroesophageal reflux disease)    Grave's disease    History of colon polyps    Hx of migraines    Hypertension    Hyperthyroidism    IBS (irritable bowel syndrome)    PONV (postoperative nausea and vomiting)    Pre-diabetes    PSVT (paroxysmal supraventricular tachycardia) (HCC)     Surgeries: Procedure(s): LEFT TOTAL KNEE ARTHROPLASTY on 09/16/2023   Consultants:   Discharged Condition: Improved  Hospital Course: Denise Ferguson is an 72 y.o. female who was admitted 09/16/2023 with a chief complaint of No chief complaint on file. , and found to have a diagnosis of Total knee replacement status, left.  They were brought to the operating room on 09/16/2023 and underwent the above named procedures.    They were given perioperative antibiotics:  Anti-infectives (From admission, onward)    Start     Dose/Rate Route Frequency Ordered Stop   09/16/23 1400  ceFAZolin (ANCEF) IVPB 2g/100 mL premix        2 g 200 mL/hr over 30 Minutes Intravenous Every 6 hours 09/16/23 1053 09/16/23 2036   09/16/23 0600  ceFAZolin (ANCEF) IVPB 2g/100 mL premix        2 g 200 mL/hr over 30 Minutes Intravenous On call to O.R. 09/16/23 2778 09/16/23 0750   09/16/23 0553  ceFAZolin (ANCEF) 2-4 GM/100ML-% IVPB       Note to Pharmacy: Denise Ferguson A: cabinet override      09/16/23 0553 09/16/23 0753     .  They were given compression stockings, early ambulation, and  chemoprophylaxis for DVT prophylaxis.  They benefited maximally from their hospital stay and there were no complications.    Recent vital signs:  Vitals:   09/17/23 0431 09/17/23 0751  BP: (!) 130/58 122/74  Pulse: 80 87  Resp: 18 16  Temp: 98.3 F (36.8 C) 98.2 F (36.8 C)  SpO2: 97% 97%    Recent laboratory studies:  Results for orders placed or performed during the hospital encounter of 09/16/23  Glucose, capillary  Result Value Ref Range   Glucose-Capillary 105 (H) 70 - 99 mg/dL    Discharge Medications:   Allergies as of 09/17/2023       Reactions   Black Cohosh Anaphylaxis   Hydrocodone-acetaminophen    Other reaction(s): GI Upset (intolerance) Other reaction(s): GI Upset (intolerance) Other reaction(s): GI Upset (intolerance)   Latex Hives   Molds & Smuts    Other reaction(s): Other (See Comments) Sneezing   Pollen Extract    Other reaction(s): Other (See Comments) Stuffy nose   Soma [carisoprodol] Nausea And Vomiting, Swelling   Tramadol Nausea And Vomiting, Rash        Medication List     TAKE these medications    Accu-Chek Guide w/Device Kit   Accu-Chek Softclix Lancets lancets   acetaminophen 500 MG tablet Commonly known as: TYLENOL Take 1,000 mg by mouth every 6 (six) hours as  needed for moderate pain.   amLODipine 10 MG tablet Commonly known as: NORVASC Take 10 mg by mouth daily.   carboxymethylcellulose 0.5 % Soln Commonly known as: REFRESH PLUS Place 1 drop into both eyes 3 (three) times daily as needed (dry eyes).   estradiol 0.1 MG/GM vaginal cream Commonly known as: ESTRACE Place 1 Applicatorful vaginally 2 (two) times a week.   estradiol 0.5 MG tablet Commonly known as: ESTRACE Take 0.5 mg by mouth daily.   gabapentin 300 MG capsule Commonly known as: NEURONTIN Take 600 mg by mouth at bedtime.   levothyroxine 75 MCG tablet Commonly known as: SYNTHROID Take 75 mcg by mouth daily before breakfast.    losartan-hydrochlorothiazide 100-25 MG tablet Commonly known as: HYZAAR Take 1 tablet by mouth daily.   metFORMIN 500 MG 24 hr tablet Commonly known as: GLUCOPHAGE-XR Take 500 mg by mouth daily with supper.   metoprolol succinate 50 MG 24 hr tablet Commonly known as: TOPROL-XL Take 1 tablet (50 mg total) by mouth daily. Take with or immediately following a meal.   omeprazole 40 MG capsule Commonly known as: PRILOSEC Take 40 mg by mouth daily.   oxyCODONE-acetaminophen 5-325 MG tablet Commonly known as: PERCOCET/ROXICET Take 1 tablet by mouth every 4 (four) hours as needed.   Pataday 0.1 % ophthalmic solution Generic drug: olopatadine Place 1 drop into both eyes 2 (two) times daily as needed for allergies.        Diagnostic Studies: No results found.  Disposition: Discharge disposition: 01-Home or Self Care       Discharge Instructions     Call MD / Call 911   Complete by: As directed    If you experience chest pain or shortness of breath, CALL 911 and be transported to the hospital emergency room.  If you develope a fever above 101 F, pus (white drainage) or increased drainage or redness at the wound, or calf pain, call your surgeon's office.   Constipation Prevention   Complete by: As directed    Drink plenty of fluids.  Prune juice may be helpful.  You may use a stool softener, such as Colace (over the counter) 100 mg twice a day.  Use MiraLax (over the counter) for constipation as needed.   Diet - low sodium heart healthy   Complete by: As directed    Increase activity slowly as tolerated   Complete by: As directed    Post-operative opioid taper instructions:   Complete by: As directed    POST-OPERATIVE OPIOID TAPER INSTRUCTIONS: It is important to wean off of your opioid medication as soon as possible. If you do not need pain medication after your surgery it is ok to stop day one. Opioids include: Codeine, Hydrocodone(Norco, Vicodin), Oxycodone(Percocet,  oxycontin) and hydromorphone amongst others.  Long term and even short term use of opiods can cause: Increased pain response Dependence Constipation Depression Respiratory depression And more.  Withdrawal symptoms can include Flu like symptoms Nausea, vomiting And more Techniques to manage these symptoms Hydrate well Eat regular healthy meals Stay active Use relaxation techniques(deep breathing, meditating, yoga) Do Not substitute Alcohol to help with tapering If you have been on opioids for less than two weeks and do not have pain than it is ok to stop all together.  Plan to wean off of opioids This plan should start within one week post op of your joint replacement. Maintain the same interval or time between taking each dose and first decrease the dose.  Cut the  total daily intake of opioids by one tablet each day Next start to increase the time between doses. The last dose that should be eliminated is the evening dose.           Follow-up Information     Nadara Mustard, MD Follow up in 1 week(s).   Specialty: Orthopedic Surgery Contact information: [address] [place] [address] [phone]                  Signed: Nadara Mustard 09/17/2023, 8:25 AM

## 2023-09-17 NOTE — Progress Notes (Signed)
Patient was nauseous and slightly dizzy after receiving dilaudid for pain. Zofran given. Will continue to monitor patient.

## 2023-09-17 NOTE — TOC Initial Note (Addendum)
Transition of Care [facility]) - Initial/Assessment Note    Patient Details  Name: Denise Ferguson MRN: [id number] Date of Birth: [birth year]  [facility] CM/SW Contact:    Epifanio Lesches, RN Phone Number: 09/17/2023, 12:42 PM  Clinical Narrative:                      -s/p L TKA 10/23 Pt from home alone. Supportive daughter. Daughter to assist with care @ home once d/c ready. PTA independent with ADL's ,no DME usage. Home health services prearrange by provider for HHPT services with [facility]. Referral made with [facility] for RW. Equipment will be delivered to beds prior to d/c. Pt without RX med concerns and transportation. Post hospital f/u 's noted on AVS. TOC team will continue to monitor and assist  with needs...  Expected Discharge Plan: Home w Home Health Services Barriers to Discharge: Continued Medical Work up   Patient Goals and CMS Choice     Choice offered to / list presented to : Patient      Expected Discharge Plan and Services   Discharge Planning Services: CM Consult     Expected Discharge Date: 09/17/23               DME Arranged: Dan Humphreys rolling   Date DME Agency Contacted: 09/17/23 Time DME Agency Contacted: 1235 Representative spoke with at DME Agency: Ian Malkin HH Arranged: PT HH Agency: [facility] Date [facility] Agency Contacted: 09/17/23 Time HH Agency Contacted: 1241 Representative spoke with at [facility] Agency: Lynnette  Prior Living Arrangements/Services   Lives with:: Self Patient language and need for interpreter reviewed:: Yes Do you feel safe going back to the place where you live?: Yes      Need for Family Participation in Patient Care: Yes (Comment) Care giver support system in place?: Yes (comment)   Criminal Activity/Legal Involvement Pertinent to Current Situation/Hospitalization: No - Comment as needed  Activities of Daily Living   ADL Screening (condition at time of admission) Independently performs ADLs?:  Yes (appropriate for developmental age) Is the patient deaf or have difficulty hearing?: No Does the patient have difficulty seeing, even when wearing glasses/contacts?: No Does the patient have difficulty concentrating, remembering, or making decisions?: No  Permission Sought/Granted                  Emotional Assessment Appearance:: Appears stated age Attitude/Demeanor/Rapport: Engaged Affect (typically observed): Appropriate Orientation: : Oriented to Self, Oriented to Place, Oriented to Situation, Oriented to  Time Alcohol / Substance Use: Not Applicable Psych Involvement: No (comment)  Admission diagnosis:  Total knee replacement status, left [Z96.652] Patient Active Problem List   Diagnosis Date Noted   Primary osteoarthritis of left knee 09/16/2023   Total knee replacement status, left 09/16/2023   Preop cardiovascular exam 08/27/2023   Retrosternal discomfort 05/01/2023   PSVT (paroxysmal supraventricular tachycardia) (HCC) 01/29/2023   History of meniscal tear    History of colon polyps 09/23/2011   GERD (gastroesophageal reflux disease) 09/23/2011   Graves' disease 09/23/2011   HTN (hypertension) 09/23/2011   Migraines 09/23/2011   Anemia 09/23/2011   PCP:  [name], NP Pharmacy:   [id number] [place], [place] - [address] AT [facility] [address] [place] [address] Phone: [phone] Fax: [phone]     Social Determinants of Health (SDOH) Social History: SDOH Screenings   Food Insecurity: No Food Insecurity (  09/16/2023)  Recent Concern: Food Insecurity - Medium Risk (09/02/2023)   Received from [facility]  Housing: Low Risk  (09/16/2023)  Transportation Needs: No Transportation Needs (09/16/2023)  Utilities: Not At Risk (09/16/2023)  Tobacco Use: Low Risk  (09/16/2023)   SDOH Interventions:     Readmission Risk Interventions     No data to display

## 2023-09-17 NOTE — Progress Notes (Signed)
Physical Therapy Treatment Patient Details Name: Denise Ferguson MRN: [id number] DOB: [birth year] Today's Date: 09/17/2023   History of Present Illness 72 y.o. female [facility] for elective L TKA. PMH includes HTN, anemia, grave's disease, GERD, DM, and arthritis    PT Comments  Pt is slowly progressing towards goals. Pt continues to be limited by pain; pain medication makes pt dizzy and nauseous. Pt was able to get to EOB at [facility] with use of gait belt for LLE mobility. Pt was able to increase gait distance but had to sit down after short less than home distance due to nausea and dizziness. Pt states that she has a distance to walk to get into her home from driveway with a slight incline. Pt was limited this session due to pain/nausea/dizziness. Will continue to follow up as able and appropriate.     If plan is discharge home, recommend the following: Assistance with cooking/housework;Assist for transportation;A little help with walking and/or transfers     Equipment Recommendations  Rolling walker (2 wheels)       Precautions / Restrictions Precautions Precautions: Knee;Fall Precaution Booklet Issued: No Restrictions Weight Bearing Restrictions: Yes LLE Weight Bearing: Weight bearing as tolerated     Mobility  Bed Mobility Overal bed mobility: Needs Assistance Bed Mobility: Supine to Sit     Supine to sit: Contact guard     General bed mobility comments: with increased time pt was able to move LLE toward EOB with use of gait belt after education    Transfers Overall transfer level: Needs assistance Equipment used: Rolling walker (2 wheels) Transfers: Sit to/from Stand, Bed to chair/wheelchair/BSC Sit to Stand: Contact guard assist   Step pivot transfers: Contact guard assist       General transfer comment: CGA for safety. Pt was dizzy after administration of pain medication    Ambulation/Gait Ambulation/Gait assistance: Contact guard  assist Gait Distance (Feet): 10 Feet (and 15 ft) Assistive device: Rolling walker (2 wheels) Gait Pattern/deviations: Step-through pattern, Step-to pattern, Decreased dorsiflexion - left, Decreased step length - right, Decreased stance time - left Gait velocity: decreased Gait velocity interpretation: <1.31 ft/sec, indicative of household ambulator   General Gait Details: decreased knee flexion on the L with cueing pt was able to take partial step through gait pattern on the R with intermittent step to. Pt had to rest after 10 ft due to hot flashes and dizziness from pain medication. Pt then ambulated 15 feet and had to sit due to dizziness/nausea from pain medication.       Balance Overall balance assessment: Needs assistance Sitting-balance support: No upper extremity supported, Single extremity supported, Bilateral upper extremity supported Sitting balance-Leahy Scale: Fair     Standing balance support: Bilateral upper extremity supported, Reliant on assistive device for balance Standing balance-Leahy Scale: Poor        Cognition Arousal: Alert Behavior During Therapy: WFL for tasks assessed/performed Overall Cognitive Status: Within Functional Limits for tasks assessed      Exercises Total Joint Exercises Goniometric ROM: ~40 degrees flexion ~(-10) degrees extension    General Comments General comments (skin integrity, edema, etc.): Pt stated she could not get out of bed wtihout pain medication. RN gave pain medication which then made pt dizzy and nauseous during session      Pertinent Vitals/Pain Pain Assessment Pain Assessment: 0-10 Pain Score: 10-Worst pain ever Pain Location: L knee Pain Descriptors / Indicators: Grimacing, Sharp Pain Intervention(s): RN gave pain meds during session, Limited  activity within patient's tolerance, Monitored during session     PT Goals (current goals can now be found in the care plan section) Acute Rehab PT Goals Patient Stated  Goal: return home PT Goal Formulation: With patient Time For Goal Achievement: 09/22/23 Potential to Achieve Goals: Good Progress towards PT goals: Progressing toward goals    Frequency    Min 1X/week      PT Plan  Continue with current POC       AM-PAC PT "6 Clicks" Mobility   Outcome Measure  Help needed turning from your back to your side while in a flat bed without using bedrails?: A Little Help needed moving from lying on your back to sitting on the side of a flat bed without using bedrails?: A Little Help needed moving to and from a bed to a chair (including a wheelchair)?: A Little Help needed standing up from a chair using your arms (e.g., wheelchair or bedside chair)?: A Little Help needed to walk in hospital room?: A Little Help needed climbing 3-5 steps with a railing? : A Lot 6 Click Score: 17    End of Session Equipment Utilized During Treatment: Gait belt Activity Tolerance: Patient limited by pain;Other (comment) (dizziness/nausea) Patient left: in chair;with call bell/phone within reach;with family/visitor present Nurse Communication: Mobility status PT Visit Diagnosis: Unsteadiness on feet (R26.81);Other abnormalities of gait and mobility (R26.89);Muscle weakness (generalized) (M62.81);Pain Pain - Right/Left: Left Pain - part of body: Knee     Time: 0630-1601 PT Time Calculation (min) (ACUTE ONLY): 38 min  Charges:    $Gait Training: 8-22 mins $Therapeutic Activity: 23-37 mins PT General Charges $$ ACUTE PT VISIT: 1 Visit                     Harrel Carina, DPT, CLT  Acute Rehabilitation Services Office: [phone] (Secure chat preferred)    [name] 09/17/2023, 10:23 AM

## 2023-09-17 NOTE — Progress Notes (Signed)
Patient ID: Denise Ferguson, female   DOB: [birth year], 72 y.o.   MRN: [id number] Patient is postoperative day 1 total knee arthroplasty.  Plan for discharge today after therapy she will take her ice machine with her.  Prescription sent for Percocet.

## 2023-09-18 ENCOUNTER — Encounter (HOSPITAL_COMMUNITY): Payer: Self-pay | Admitting: Orthopedic Surgery

## 2023-09-18 DIAGNOSIS — M1712 Unilateral primary osteoarthritis, left knee: Secondary | ICD-10-CM | POA: Diagnosis not present

## 2023-09-18 NOTE — Plan of Care (Signed)
  Problem: Education: Goal: Knowledge of General Education information will improve Description: Including pain rating scale, medication(s)/side effects and non-pharmacologic comfort measures Outcome: Progressing   Problem: Health Behavior/Discharge Planning: Goal: Ability to manage health-related needs will improve Outcome: Progressing   Problem: Activity: Goal: Risk for activity intolerance will decrease Outcome: Progressing   Problem: Pain Management: Goal: General experience of comfort will improve Outcome: Progressing

## 2023-09-18 NOTE — Progress Notes (Signed)
Discharge instructions given. Patient verbalized understanding and all questions were answered.  ?

## 2023-09-18 NOTE — Progress Notes (Signed)
Patient ID: Denise Ferguson, female   DOB: [birth year], 72 y.o.   MRN: [id number] Discharge delayed from yesterday secondary to nausea and vomiting during the day.  Plan for discharge today after therapy.

## 2023-09-18 NOTE — Discharge Summary (Signed)
Physician Discharge Summary  Patient ID: Denise Ferguson MRN: [id number] DOB/AGE: December 27, 1950 72 y.o.  Admit date: 09/16/2023 Discharge date: 09/18/2023  Admission Diagnoses:  Principal Problem:   Total knee replacement status, left Active Problems:   Primary osteoarthritis of left knee   Discharge Diagnoses:  Same  Past Medical History:  Diagnosis Date   Allergy    Anemia    Arthritis    knees   Diabetes mellitus without complication (HCC)    " Pre" DM- on Metformin    FHx: migraine headaches    GERD (gastroesophageal reflux disease)    Grave's disease    History of colon polyps    Hx of migraines    Hypertension    Hyperthyroidism    IBS (irritable bowel syndrome)    PONV (postoperative nausea and vomiting)    Pre-diabetes    PSVT (paroxysmal supraventricular tachycardia) (HCC)     Surgeries: Procedure(s): LEFT TOTAL KNEE ARTHROPLASTY on 09/16/2023   Consultants:   Discharged Condition: Improved  Hospital Course: Denise Ferguson is an 72 y.o. female who was admitted 09/16/2023 with a chief complaint of No chief complaint on file. , and found to have a diagnosis of Total knee replacement status, left.  They were brought to the operating room on 09/16/2023 and underwent the above named procedures.    They were given perioperative antibiotics:  Anti-infectives (From admission, onward)    Start     Dose/Rate Route Frequency Ordered Stop   09/16/23 1400  ceFAZolin (ANCEF) IVPB 2g/100 mL premix        2 g 200 mL/hr over 30 Minutes Intravenous Every 6 hours 09/16/23 1053 09/16/23 2036   09/16/23 0600  ceFAZolin (ANCEF) IVPB 2g/100 mL premix        2 g 200 mL/hr over 30 Minutes Intravenous On call to O.R. 09/16/23 7829 09/16/23 0750   09/16/23 0553  ceFAZolin (ANCEF) 2-4 GM/100ML-% IVPB       Note to Pharmacy: Lacie Draft A: cabinet override      09/16/23 0553 09/16/23 0753     .  They were given compression stockings, early ambulation, and  chemoprophylaxis for DVT prophylaxis.  They benefited maximally from their hospital stay and there were no complications.    Recent vital signs:  Vitals:   09/17/23 2016 09/18/23 0532  BP: 109/65 123/73  Pulse: 82 83  Resp: 17 17  Temp: 97.6 F (36.4 C) 98.1 F (36.7 C)  SpO2: 95% (!) 89%    Recent laboratory studies:  Results for orders placed or performed during the hospital encounter of 09/16/23  Glucose, capillary  Result Value Ref Range   Glucose-Capillary 105 (H) 70 - 99 mg/dL    Discharge Medications:   Allergies as of 09/18/2023       Reactions   Black Cohosh Anaphylaxis   Hydrocodone-acetaminophen    Other reaction(s): GI Upset (intolerance) Other reaction(s): GI Upset (intolerance) Other reaction(s): GI Upset (intolerance)   Latex Hives   Molds & Smuts    Other reaction(s): Other (See Comments) Sneezing   Pollen Extract    Other reaction(s): Other (See Comments) Stuffy nose   Soma [carisoprodol] Nausea And Vomiting, Swelling   Tramadol Nausea And Vomiting, Rash        Medication List     TAKE these medications    Accu-Chek Guide w/Device Kit   Accu-Chek Softclix Lancets lancets   acetaminophen 500 MG tablet Commonly known as: TYLENOL Take 1,000 mg by mouth every 6 (six) hours as  needed for moderate pain.   amLODipine 10 MG tablet Commonly known as: NORVASC Take 10 mg by mouth daily.   carboxymethylcellulose 0.5 % Soln Commonly known as: REFRESH PLUS Place 1 drop into both eyes 3 (three) times daily as needed (dry eyes).   estradiol 0.1 MG/GM vaginal cream Commonly known as: ESTRACE Place 1 Applicatorful vaginally 2 (two) times a week.   estradiol 0.5 MG tablet Commonly known as: ESTRACE Take 0.5 mg by mouth daily.   gabapentin 300 MG capsule Commonly known as: NEURONTIN Take 600 mg by mouth at bedtime.   levothyroxine 75 MCG tablet Commonly known as: SYNTHROID Take 75 mcg by mouth daily before breakfast.    losartan-hydrochlorothiazide 100-25 MG tablet Commonly known as: HYZAAR Take 1 tablet by mouth daily.   metFORMIN 500 MG 24 hr tablet Commonly known as: GLUCOPHAGE-XR Take 500 mg by mouth daily with supper.   metoprolol succinate 50 MG 24 hr tablet Commonly known as: TOPROL-XL Take 1 tablet (50 mg total) by mouth daily. Take with or immediately following a meal.   omeprazole 40 MG capsule Commonly known as: PRILOSEC Take 40 mg by mouth daily.   oxyCODONE-acetaminophen 5-325 MG tablet Commonly known as: PERCOCET/ROXICET Take 1 tablet by mouth every 4 (four) hours as needed.   Pataday 0.1 % ophthalmic solution Generic drug: olopatadine Place 1 drop into both eyes 2 (two) times daily as needed for allergies.               Durable Medical Equipment  (From admission, onward)           Start     Ordered   09/17/23 1231  For home use only DME Walker rolling  Once       Question Answer Comment  Walker: With 5 Inch Wheels   Patient needs a walker to treat with the following condition S/P TKR (total knee replacement)      09/17/23 1231            Diagnostic Studies: No results found.  Disposition: Discharge disposition: 01-Home or Self Care       Discharge Instructions     Call MD / Call 911   Complete by: As directed    If you experience chest pain or shortness of breath, CALL 911 and be transported to the hospital emergency room.  If you develope a fever above 101 F, pus (white drainage) or increased drainage or redness at the wound, or calf pain, call your surgeon's office.   Call MD / Call 911   Complete by: As directed    If you experience chest pain or shortness of breath, CALL 911 and be transported to the hospital emergency room.  If you develope a fever above 101 F, pus (white drainage) or increased drainage or redness at the wound, or calf pain, call your surgeon's office.   Constipation Prevention   Complete by: As directed    Drink plenty of  fluids.  Prune juice may be helpful.  You may use a stool softener, such as Colace (over the counter) 100 mg twice a day.  Use MiraLax (over the counter) for constipation as needed.   Constipation Prevention   Complete by: As directed    Drink plenty of fluids.  Prune juice may be helpful.  You may use a stool softener, such as Colace (over the counter) 100 mg twice a day.  Use MiraLax (over the counter) for constipation as needed.   Diet - low sodium  heart healthy   Complete by: As directed    Diet - low sodium heart healthy   Complete by: As directed    Increase activity slowly as tolerated   Complete by: As directed    Increase activity slowly as tolerated   Complete by: As directed    Post-operative opioid taper instructions:   Complete by: As directed    POST-OPERATIVE OPIOID TAPER INSTRUCTIONS: It is important to wean off of your opioid medication as soon as possible. If you do not need pain medication after your surgery it is ok to stop day one. Opioids include: Codeine, Hydrocodone(Norco, Vicodin), Oxycodone(Percocet, oxycontin) and hydromorphone amongst others.  Long term and even short term use of opiods can cause: Increased pain response Dependence Constipation Depression Respiratory depression And more.  Withdrawal symptoms can include Flu like symptoms Nausea, vomiting And more Techniques to manage these symptoms Hydrate well Eat regular healthy meals Stay active Use relaxation techniques(deep breathing, meditating, yoga) Do Not substitute Alcohol to help with tapering If you have been on opioids for less than two weeks and do not have pain than it is ok to stop all together.  Plan to wean off of opioids This plan should start within one week post op of your joint replacement. Maintain the same interval or time between taking each dose and first decrease the dose.  Cut the total daily intake of opioids by one tablet each day Next start to increase the time between  doses. The last dose that should be eliminated is the evening dose.      Post-operative opioid taper instructions:   Complete by: As directed    POST-OPERATIVE OPIOID TAPER INSTRUCTIONS: It is important to wean off of your opioid medication as soon as possible. If you do not need pain medication after your surgery it is ok to stop day one. Opioids include: Codeine, Hydrocodone(Norco, Vicodin), Oxycodone(Percocet, oxycontin) and hydromorphone amongst others.  Long term and even short term use of opiods can cause: Increased pain response Dependence Constipation Depression Respiratory depression And more.  Withdrawal symptoms can include Flu like symptoms Nausea, vomiting And more Techniques to manage these symptoms Hydrate well Eat regular healthy meals Stay active Use relaxation techniques(deep breathing, meditating, yoga) Do Not substitute Alcohol to help with tapering If you have been on opioids for less than two weeks and do not have pain than it is ok to stop all together.  Plan to wean off of opioids This plan should start within one week post op of your joint replacement. Maintain the same interval or time between taking each dose and first decrease the dose.  Cut the total daily intake of opioids by one tablet each day Next start to increase the time between doses. The last dose that should be eliminated is the evening dose.           Follow-up Information     Nadara Mustard, MD Follow up in 1 week(s).   Specialty: Orthopedic Surgery Contact information: [address] [place] [address] [phone]         [name], NP Follow up.   Specialty: Nurse Practitioner Contact information: [address] [place] [address] [phone]         [facility] Follow up.   Specialty: Home Health Services Why: [facility], start of care within 48 hours post discharge Contact  information: [address] [place]  [place] [address] [phone]                  Signed: Nadara Mustard 09/18/2023, 6:47 AM

## 2023-09-18 NOTE — Progress Notes (Signed)
Physical Therapy Treatment Patient Details Name: Denise Ferguson MRN: [id number] DOB: 08/29/1951 Today's Date: 09/18/2023   History of Present Illness 72 y.o. female presents to [facility] on 09/16/2023 for elective L TKA. PMH includes HTN, anemia, grave's disease, GERD, DM, and arthritis    PT Comments  Pt continues to progress towards goals. Pt is Mod I for bed mobility and sit to stand this session. Supervision for gait. Slight increase in time due to pain for sit to stand. Pt was able to increase gait distance this session for functional short community distances. Pt cleared from a physical therapist perspective for home with intermittent assistance on discharge from acute care hospital setting once medically stable. Will continue to follow up as able and appropriate.  Pt educated on car transfers this session.    If plan is discharge home, recommend the following: Assistance with cooking/housework;Assist for transportation;A little help with walking and/or transfers     Equipment Recommendations  Rolling walker (2 wheels)       Precautions / Restrictions Precautions Precautions: Knee;Fall Precaution Booklet Issued: No Restrictions Weight Bearing Restrictions: Yes LLE Weight Bearing: Weight bearing as tolerated     Mobility  Bed Mobility Overal bed mobility: Modified Independent Bed Mobility: Sit to Supine, Supine to Sit     Supine to sit: Modified independent (Device/Increase time) Sit to supine: Modified independent (Device/Increase time)   General bed mobility comments: uses UE to assist with LLE    Transfers Overall transfer level: Modified independent Equipment used: Rolling walker (2 wheels) Transfers: Sit to/from Stand Sit to Stand: Modified independent (Device/Increase time)           General transfer comment: increased time due to pain in the L Knee    Ambulation/Gait Ambulation/Gait assistance: Supervision Gait Distance (Feet): 150 Feet Assistive  device: Rolling walker (2 wheels) Gait Pattern/deviations: Step-through pattern, Decreased dorsiflexion - left, Decreased step length - right, Decreased stance time - left Gait velocity: decreased Gait velocity interpretation: 1.31 - 2.62 ft/sec, indicative of limited community ambulator   General Gait Details: improved gait this session with consistent step through gait pattern.Verbal cues for heel toe gait pattern and increased flexion on the L.   Stairs Stairs:  (not applicable pt has no stairs to get into her home.)               Balance Overall balance assessment: Mild deficits observed, not formally tested   Sitting balance-Leahy Scale: Normal     Standing balance support: No upper extremity supported, Bilateral upper extremity supported, During functional activity Standing balance-Leahy Scale: Fair Standing balance comment: no overt LOB        Cognition Arousal: Alert Behavior During Therapy: WFL for tasks assessed/performed Overall Cognitive Status: Within Functional Limits for tasks assessed        Exercises Total Joint Exercises Goniometric ROM: ~ 50 degrees flexion ~(-10) degrees extension    General Comments General comments (skin integrity, edema, etc.): Daughter present in room during session and will be home with patient.      Pertinent Vitals/Pain Pain Assessment Pain Assessment: Faces Faces Pain Scale: Hurts a little bit Pain Location: L knee Pain Descriptors / Indicators: Grimacing Pain Intervention(s): Monitored during session, Limited activity within patient's tolerance     PT Goals (current goals can now be found in the care plan section) Acute Rehab PT Goals Patient Stated Goal: return home PT Goal Formulation: With patient Time For Goal Achievement: 09/22/23 Potential to Achieve Goals: Good Progress towards PT  goals: Progressing toward goals    Frequency    Min 1X/week      PT Plan  Continue with current POC        AM-PAC PT "6 Clicks" Mobility   Outcome Measure  Help needed turning from your back to your side while in a flat bed without using bedrails?: None Help needed moving from lying on your back to sitting on the side of a flat bed without using bedrails?: None Help needed moving to and from a bed to a chair (including a wheelchair)?: None Help needed standing up from a chair using your arms (e.g., wheelchair or bedside chair)?: None Help needed to walk in hospital room?: A Little Help needed climbing 3-5 steps with a railing? : A Little 6 Click Score: 22    End of Session Equipment Utilized During Treatment: Gait belt Activity Tolerance: Patient tolerated treatment well Patient left: in bed;with call bell/phone within reach;with family/visitor present Nurse Communication: Mobility status Pain - Right/Left: Right Pain - part of body: Knee     Time: 1147-1200 PT Time Calculation (min) (ACUTE ONLY): 13 min  Charges:    $Gait Training: 8-22 mins PT General Charges $$ ACUTE PT VISIT: 1 Visit                    Harrel Carina, DPT, CLT  Acute Rehabilitation Services Office: [phone] (Secure chat preferred)    [name] 09/18/2023, 12:05 PM

## 2023-09-25 ENCOUNTER — Telehealth: Payer: Self-pay | Admitting: Orthopedic Surgery

## 2023-09-25 ENCOUNTER — Other Ambulatory Visit: Payer: Self-pay | Admitting: Orthopedic Surgery

## 2023-09-25 DIAGNOSIS — Z96652 Presence of left artificial knee joint: Secondary | ICD-10-CM

## 2023-09-25 NOTE — Telephone Encounter (Signed)
Denise Ferguson pt, she would like to come to PT upstairs at our office. I will put in the referral.

## 2023-09-25 NOTE — Telephone Encounter (Signed)
Patient called asked if she has been set up for outpatient (PT)? The number to contact patient is [phone]

## 2023-09-30 ENCOUNTER — Other Ambulatory Visit (INDEPENDENT_AMBULATORY_CARE_PROVIDER_SITE_OTHER): Payer: Medicare HMO

## 2023-09-30 ENCOUNTER — Encounter: Payer: Self-pay | Admitting: Family

## 2023-09-30 ENCOUNTER — Ambulatory Visit (INDEPENDENT_AMBULATORY_CARE_PROVIDER_SITE_OTHER): Payer: Medicare HMO | Admitting: Family

## 2023-09-30 DIAGNOSIS — Z96652 Presence of left artificial knee joint: Secondary | ICD-10-CM | POA: Diagnosis not present

## 2023-09-30 NOTE — Progress Notes (Signed)
Post-Op Visit Note   Patient: Denise Ferguson           Date of Birth: [birth year]           MRN: [id number] Visit Date: 09/30/2023 PCP: [name], NP  Chief Complaint:  Chief Complaint  Patient presents with   Left Knee - Routine Post Op    09/16/2023 left TKA    HPI:  HPI The patient is a 72 year old woman who presents status post left total knee arthroplasty October 23.  Staples are in place she has not yet begun home health physical therapy this is set to begin next week  Is using about 1 oxycodone a day Tylenol as needed  Ortho Exam On examination left knee staples in place the incision is clean dry and intact she lacks about 10 degrees full extension flexion to 90  Visit Diagnoses:  1. S/P total knee replacement, left     Plan: Continue with physical therapy.  Staples harvested today without incident.  May shower may get this wet we will advance weightbearing as tolerated  Follow-Up Instructions: No follow-ups on file.   Imaging: No results found.  Orders:  Orders Placed This Encounter  Procedures   XR KNEE 3 VIEW LEFT   No orders of the defined types were placed in this encounter.    PMFS History: Patient Active Problem List   Diagnosis Date Noted   Primary osteoarthritis of left knee 09/16/2023   Total knee replacement status, left 09/16/2023   Preop cardiovascular exam 08/27/2023   Retrosternal discomfort 05/01/2023   PSVT (paroxysmal supraventricular tachycardia) (HCC) 01/29/2023   History of meniscal tear    History of colon polyps 09/23/2011   GERD (gastroesophageal reflux disease) 09/23/2011   Graves' disease 09/23/2011   HTN (hypertension) 09/23/2011   Migraines 09/23/2011   Anemia 09/23/2011   Past Medical History:  Diagnosis Date   Allergy    Anemia    Arthritis    knees   Diabetes mellitus without complication (HCC)    " Pre" DM- on Metformin    FHx: migraine headaches    GERD (gastroesophageal reflux disease)    Grave's  disease    History of colon polyps    Hx of migraines    Hypertension    Hyperthyroidism    IBS (irritable bowel syndrome)    PONV (postoperative nausea and vomiting)    Pre-diabetes    PSVT (paroxysmal supraventricular tachycardia) (HCC)     Family History  Problem Relation Age of Onset   Hypertension Mother    Diabetes Father    Hypertension Father    Diabetes Brother    Lung cancer Other    Alcohol abuse Other        uncle   Cancer Other        uncle   Colon cancer Neg Hx    Colon polyps Neg Hx    Esophageal cancer Neg Hx    Rectal cancer Neg Hx    Stomach cancer Neg Hx     Past Surgical History:  Procedure Laterality Date   ABDOMINAL HYSTERECTOMY     BREAST BIOPSY Left    CARPAL TUNNEL RELEASE  2000   bilateral   CATARACT EXTRACTION Bilateral 03/20/2021   COLONOSCOPY     KNEE ARTHROSCOPY WITH LATERAL MENISECTOMY  07/16/2021   Procedure: KNEE ARTHROSCOPY WITH LATERAL MENISECTOMY;  Surgeon: Nadara Mustard, MD;  Location: [facility];  Service: Orthopedics;;   KNEE ARTHROSCOPY WITH MEDIAL  MENISECTOMY Right 07/16/2021   Procedure: ARTHROSCOPIC DEBRIDEMENT RIGHT KNEE, PARTIAL MEDIAL AND LATERAL MENISECTOMIES;  Surgeon: Nadara Mustard, MD;  Location: [facility];  Service: Orthopedics;  Laterality: Right;   POLYPECTOMY     thyroid ablation     x2   TONSILLECTOMY     TOTAL KNEE ARTHROPLASTY Left 09/16/2023   Procedure: LEFT TOTAL KNEE ARTHROPLASTY;  Surgeon: Nadara Mustard, MD;  Location: [facility] OR;  Service: Orthopedics;  Laterality: Left;   TUBAL LIGATION  1980   Social History   Occupational History   Occupation: retired Designer, industrial/product  Tobacco Use   Smoking status: Never   Smokeless tobacco: Never  Vaping Use   Vaping status: Never Used  Substance and Sexual Activity   Alcohol use: No    Alcohol/week: 0.0 standard drinks of alcohol   Drug use: No   Sexual activity: Not Currently    Birth control/protection: Surgical

## 2023-10-08 ENCOUNTER — Ambulatory Visit: Payer: Medicare HMO | Admitting: Rehabilitative and Restorative Service Providers"

## 2023-10-09 NOTE — Therapy (Signed)
OUTPATIENT PHYSICAL THERAPY  EVALUATION   Patient Name: Denise Ferguson MRN: [id number] DOB:[birth year], 72 y.o., female Today's Date: 10/12/2023  END OF SESSION:  PT End of Session - 10/12/23 1458     Visit Number 1    Number of Visits 24    Date for PT Re-Evaluation 01/04/24    Authorization Type [employer] $25 copay    Authorization - Number of Visits 12    Progress Note Due on Visit 10    PT Start Time 1509    PT Stop Time 1540    PT Time Calculation (min) 31 min    Activity Tolerance Patient limited by pain    Behavior During Therapy WFL for tasks assessed/performed             Past Medical History:  Diagnosis Date   Allergy    Anemia    Arthritis    knees   Diabetes mellitus without complication (HCC)    " Pre" DM- on Metformin    FHx: migraine headaches    GERD (gastroesophageal reflux disease)    Grave's disease    History of colon polyps    Hx of migraines    Hypertension    Hyperthyroidism    IBS (irritable bowel syndrome)    PONV (postoperative nausea and vomiting)    Pre-diabetes    PSVT (paroxysmal supraventricular tachycardia) (HCC)    Past Surgical History:  Procedure Laterality Date   ABDOMINAL HYSTERECTOMY     BREAST BIOPSY Left    CARPAL TUNNEL RELEASE  2000   bilateral   CATARACT EXTRACTION Bilateral 03/20/2021   COLONOSCOPY     KNEE ARTHROSCOPY WITH LATERAL MENISECTOMY  07/16/2021   Procedure: KNEE ARTHROSCOPY WITH LATERAL MENISECTOMY;  Surgeon: Nadara Mustard, MD;  Location: [facility];  Service: Orthopedics;;   KNEE ARTHROSCOPY WITH MEDIAL MENISECTOMY Right 07/16/2021   Procedure: ARTHROSCOPIC DEBRIDEMENT RIGHT KNEE, PARTIAL MEDIAL AND LATERAL MENISECTOMIES;  Surgeon: Nadara Mustard, MD;  Location: [facility];  Service: Orthopedics;  Laterality: Right;   POLYPECTOMY     thyroid ablation     x2   TONSILLECTOMY     TOTAL KNEE ARTHROPLASTY Left 09/16/2023   Procedure: LEFT TOTAL KNEE ARTHROPLASTY;  Surgeon:  Nadara Mustard, MD;  Location: [facility] OR;  Service: Orthopedics;  Laterality: Left;   TUBAL LIGATION  1980   Patient Active Problem List   Diagnosis Date Noted   Primary osteoarthritis of left knee 09/16/2023   Total knee replacement status, left 09/16/2023   Preop cardiovascular exam 08/27/2023   Retrosternal discomfort 05/01/2023   PSVT (paroxysmal supraventricular tachycardia) (HCC) 01/29/2023   History of meniscal tear    History of colon polyps 09/23/2011   GERD (gastroesophageal reflux disease) 09/23/2011   Graves' disease 09/23/2011   HTN (hypertension) 09/23/2011   Migraines 09/23/2011   Anemia 09/23/2011    PCP: [name] NP  REFERRING PROVIDER: Nadara Mustard, MD  REFERRING DIAG: [phone] (ICD-10-CM) - Status post total knee replacement, left  THERAPY DIAG:  Chronic pain of left knee  Muscle weakness (generalized)  Stiffness of left knee, not elsewhere classified  Difficulty in walking, not elsewhere classified  Rationale for Evaluation and Treatment: Rehabilitation  ONSET DATE: Surgery 09/16/2023  SUBJECTIVE:   SUBJECTIVE STATEMENT: Came to clinic s/p recent Lt TKA 09/16/2023.  Reported chronic symptoms prior to surgery.  Tightness with inactivity noted.  Warm feel noted.    PERTINENT HISTORY: History of arthritis, DM, GERD, HTN, hyperthyroidism,  IBS,   PAIN:  NPRS scale: up to 8/10 Pain location: Lt knee Pain description: shooting, tightness Aggravating factors:  end ranges, static positioning for tightness, nighttime pains, WB Relieving factors: ice  PRECAUTIONS: None  WEIGHT BEARING RESTRICTIONS: No  FALLS:  Has patient fallen in last 6 months? No  LIVING ENVIRONMENT: Lives with: Lives alone Lives in: House/apartment Stairs: no stairs at home Has following equipment at home: FWW  Daughter lives on a 3rd floor.   OCCUPATION: Retired  PLOF: Independent, shopping, housework.    PATIENT GOALS: Reduce pain, get back to walking.    OBJECTIVE:   PATIENT SURVEYS:  10/12/2023 FOTO intake: 47   predicted:  55  COGNITION: 10/12/2023 Overall cognitive status: WFL    SENSATION: 10/12/2023 Not tested  EDEMA:  10/12/2023 Localized edema noted Lt knee, lower leg.   MUSCLE LENGTH: 10/12/2023 No specific testing  POSTURE:  10/12/2023 No Significant postural limitations  PALPATION: 10/12/2023 General tenderness to light touch noted.   LOWER EXTREMITY ROM:   ROM Right 10/12/2023 Left 10/12/2023  Hip flexion    Hip extension    Hip abduction    Hip adduction    Hip internal rotation    Hip external rotation    Knee flexion  85 AROM in supine heel slide  Knee extension  -17 AROM in seated LAQ    Ankle dorsiflexion    Ankle plantarflexion    Ankle inversion    Ankle eversion     (Blank rows = not tested)  LOWER EXTREMITY MMT:  MMT Right 10/12/2023 Left 10/12/2023  Hip flexion 5/5 4/5  Hip extension    Hip abduction    Hip adduction    Hip internal rotation    Hip external rotation    Knee flexion 5/5 3+/5  Knee extension 5/5 2+/5  Ankle dorsiflexion 5/5 4/5  Ankle plantarflexion    Ankle inversion    Ankle eversion     (Blank rows = not tested)  LOWER EXTREMITY SPECIAL TESTS:  10/12/2023 No specific testing today  FUNCTIONAL TESTS:  10/12/2023 TUG:  FWW: 19.76 seconds  18 inch chair transfer: able without UE on chair but on knees and deviation to Rt leg.  Lt SLS: unable Rt SLS: < 3 seconds  GAIT: 10/12/2023 With FWW lacking full TKE, reduced stance                                                                                                                                                                         TODAY'S TREATMENT  DATE: 10/12/2023 Therex:    HEP instruction/performance c cues for techniques, handout provided.  Trial set performed of each for comprehension and symptom assessment.   See below for exercise list  Vaso 10 mins in elevation medium compression 34 deg Lt knee   PATIENT EDUCATION:  10/12/2023 Education details: HEP, POC Person educated: Patient Education method: Programmer, multimedia, Demonstration, Verbal cues, and Handouts Education comprehension: verbalized understanding, returned demonstration, and verbal cues required  HOME EXERCISE PROGRAM: Access Code: [email or web address] URL: [email or web address] Date: 10/12/2023 Prepared by: Chyrel Masson  Exercises - Supine Heel Slide (Mirrored)  - 3-5 x daily - 7 x weekly - 1 sets - 10 reps - 5 hold - Supine Heel Slide with Strap  - 3-5 x daily - 7 x weekly - 1 sets - 10 reps - 5 hold - Seated Long Arc Quad (Mirrored)  - 3-5 x daily - 7 x weekly - 1 sets - 5-10 reps - 2 hold - Supine Knee Extension Mobilization with Weight (Mirrored)  - 4-5 x daily - 7 x weekly - 1 sets - 1 reps - to tolerance up to 15 mins hold - Seated Quad Set (Mirrored)  - 3-5 x daily - 7 x weekly - 1 sets - 10 reps - 5 hold - Seated Knee Flexion AAROM (Mirrored)  - 3-5 x daily - 7 x weekly - 1 sets - 5 reps - 10-15 hold  ASSESSMENT:  CLINICAL IMPRESSION: Patient is a 72 y.o. who comes to clinic with complaints of Lt knee pain s/p recent Lt TKA with mobility, strength and movement coordination deficits that impair their ability to perform usual daily and recreational functional activities without increase difficulty/symptoms at this time.  Patient to benefit from skilled PT services to address impairments and limitations to improve to previous level of function without restriction secondary to condition.   OBJECTIVE IMPAIRMENTS: Abnormal gait, decreased activity tolerance, decreased balance, decreased coordination, decreased endurance, decreased mobility, difficulty walking, decreased ROM, decreased strength, hypomobility, increased edema, increased fascial restrictions, impaired perceived functional ability, increased muscle spasms, impaired  flexibility, improper body mechanics, and pain.   ACTIVITY LIMITATIONS: carrying, lifting, bending, sitting, standing, squatting, sleeping, stairs, transfers, bed mobility, and locomotion level  PARTICIPATION LIMITATIONS: meal prep, cleaning, laundry, interpersonal relationship, driving, shopping, and community activity  PERSONAL FACTORS:  History of arthritis, DM, GERD, HTN, hyperthyroidism, IBS,   are also affecting patient's functional outcome.   REHAB POTENTIAL: Good  CLINICAL DECISION MAKING: Stable/uncomplicated  EVALUATION COMPLEXITY: Low   GOALS: Goals reviewed with patient? Yes  SHORT TERM GOALS: (target date for Short term goals are 3 weeks 11/02/2023)   1.  Patient will demonstrate independent use of home exercise program to maintain progress from in clinic treatments.  Goal status: New  LONG TERM GOALS: (target dates for all long term goals are 12 weeks  01/04/2024 )   1. Patient will demonstrate/report pain at worst less than or equal to 2/10 to facilitate minimal limitation in daily activity secondary to pain symptoms.  Goal status: New   2. Patient will demonstrate independent use of home exercise program to facilitate ability to maintain/progress functional gains from skilled physical therapy services.  Goal status: New   3. Patient will demonstrate FOTO outcome > or = 55 % to indicate reduced disability due to condition.  Goal status: New   4.  Patient will demonstrate Lt LE MMT 5/5 throughout to faciltiate usual transfers, stairs, squatting at [facility] for daily life.   Goal status: New  5.  Patient will demonstrate Lt knee AROM 0-110 deg to facilitate usual transfers, ambulation and other daily activity at PLOF Goal status: New   6.  Patient will demonstrate independent ambulation community distances > 300 ft for community integration Goal status: New   7. Patient will demonstrate ascending/descending stairs reciprocally s UE assist for community  integration.   Goal Status: New   PLAN:  PT FREQUENCY: 1-2x/week  PT DURATION: 12 weeks  PLANNED INTERVENTIONS: Can include 57846- PT Re-evaluation, 97110-Therapeutic exercises, 97530- Therapeutic activity, 97112- Neuromuscular re-education, 97535- Self Care, 97140- Manual therapy, [phone]- Gait training, 97014- Electrical stimulation (unattended),  97016- Vasopneumatic device,   Patient/Family education, Balance training, Stair training, Taping, Dry Needling, Joint mobilization, Joint manipulation, Spinal manipulation, Spinal mobilization, Scar mobilization, Vestibular training, Visual/preceptual remediation/compensation, DME instructions, Cryotherapy, and Moist heat.  All performed as medically necessary.  All included unless contraindicated  PLAN FOR NEXT SESSION: Review HEP knowledge/results.  Mobility gains, strength gains early rehab.    Chyrel Masson, PT, DPT, OCS, ATC 10/12/23  3:43 PM   Referring diagnosis? M84.132 (ICD-10-CM) - Status post total knee replacement, left Treatment diagnosis? (if different than referring diagnosis) M25.562 What was this (referring dx) caused by? [x]  Surgery []  Fall []  Ongoing issue []  Arthritis []  Other: ____________  Laterality: []  Rt [x]  Lt []  Both  Check all possible CPT codes:  *CHOOSE 10 OR LESS*    See Planned Interventions listed in the Plan section of the Evaluation.

## 2023-10-12 ENCOUNTER — Ambulatory Visit: Payer: Medicare HMO | Admitting: Rehabilitative and Restorative Service Providers"

## 2023-10-12 ENCOUNTER — Encounter: Payer: Self-pay | Admitting: Rehabilitative and Restorative Service Providers"

## 2023-10-12 ENCOUNTER — Other Ambulatory Visit: Payer: Self-pay

## 2023-10-12 DIAGNOSIS — G8929 Other chronic pain: Secondary | ICD-10-CM

## 2023-10-12 DIAGNOSIS — M25662 Stiffness of left knee, not elsewhere classified: Secondary | ICD-10-CM

## 2023-10-12 DIAGNOSIS — M6281 Muscle weakness (generalized): Secondary | ICD-10-CM

## 2023-10-12 DIAGNOSIS — M25562 Pain in left knee: Secondary | ICD-10-CM

## 2023-10-12 DIAGNOSIS — R262 Difficulty in walking, not elsewhere classified: Secondary | ICD-10-CM

## 2023-10-15 NOTE — Therapy (Signed)
OUTPATIENT PHYSICAL THERAPY  TREATMENT   Patient Name: Denise Ferguson MRN: [id number] DOB:01/08/1951, 72 y.o., female Today's Date: 10/16/2023  END OF SESSION:  PT End of Session - 10/16/23 1252     Visit Number 2    Number of Visits 24    Date for PT Re-Evaluation 01/04/24    Authorization Type [employer] $25 copay    Authorization Time Period 10/12/2023 -01/03/2023    Authorization - Visit Number 2    Authorization - Number of Visits 12    Progress Note Due on Visit 10    PT Start Time 1252    PT Stop Time 1341    PT Time Calculation (min) 49 min    Activity Tolerance Patient limited by pain    Behavior During Therapy WFL for tasks assessed/performed              Past Medical History:  Diagnosis Date   Allergy    Anemia    Arthritis    knees   Diabetes mellitus without complication (HCC)    " Pre" DM- on Metformin    FHx: migraine headaches    GERD (gastroesophageal reflux disease)    Grave's disease    History of colon polyps    Hx of migraines    Hypertension    Hyperthyroidism    IBS (irritable bowel syndrome)    PONV (postoperative nausea and vomiting)    Pre-diabetes    PSVT (paroxysmal supraventricular tachycardia) (HCC)    Past Surgical History:  Procedure Laterality Date   ABDOMINAL HYSTERECTOMY     BREAST BIOPSY Left    CARPAL TUNNEL RELEASE  2000   bilateral   CATARACT EXTRACTION Bilateral 03/20/2021   COLONOSCOPY     KNEE ARTHROSCOPY WITH LATERAL MENISECTOMY  07/16/2021   Procedure: KNEE ARTHROSCOPY WITH LATERAL MENISECTOMY;  Surgeon: Nadara Mustard, MD;  Location: [facility];  Service: Orthopedics;;   KNEE ARTHROSCOPY WITH MEDIAL MENISECTOMY Right 07/16/2021   Procedure: ARTHROSCOPIC DEBRIDEMENT RIGHT KNEE, PARTIAL MEDIAL AND LATERAL MENISECTOMIES;  Surgeon: Nadara Mustard, MD;  Location: [facility];  Service: Orthopedics;  Laterality: Right;   POLYPECTOMY     thyroid ablation     x2   TONSILLECTOMY     TOTAL  KNEE ARTHROPLASTY Left 09/16/2023   Procedure: LEFT TOTAL KNEE ARTHROPLASTY;  Surgeon: Nadara Mustard, MD;  Location: [facility] OR;  Service: Orthopedics;  Laterality: Left;   TUBAL LIGATION  1980   Patient Active Problem List   Diagnosis Date Noted   Primary osteoarthritis of left knee 09/16/2023   Total knee replacement status, left 09/16/2023   Preop cardiovascular exam 08/27/2023   Retrosternal discomfort 05/01/2023   PSVT (paroxysmal supraventricular tachycardia) (HCC) 01/29/2023   History of meniscal tear    History of colon polyps 09/23/2011   GERD (gastroesophageal reflux disease) 09/23/2011   Graves' disease 09/23/2011   HTN (hypertension) 09/23/2011   Migraines 09/23/2011   Anemia 09/23/2011    PCP: [name] NP  REFERRING PROVIDER: Nadara Mustard, MD  REFERRING DIAG: [phone] (ICD-10-CM) - Status post total knee replacement, left  THERAPY DIAG:  Chronic pain of left knee  Muscle weakness (generalized)  Stiffness of left knee, not elsewhere classified  Difficulty in walking, not elsewhere classified  Rationale for Evaluation and Treatment: Rehabilitation  ONSET DATE: Surgery 09/16/2023  SUBJECTIVE:   SUBJECTIVE STATEMENT: Pt indicated having stiffness and 3/10 pain upon arrival.  Reported no specific questions about exercises.   PERTINENT  HISTORY: History of arthritis, DM, GERD, HTN, hyperthyroidism, IBS,   PAIN:  NPRS scale: 3/10 upon arrival Pain location: Lt knee Pain description: shooting, tightness Aggravating factors:  end ranges, static positioning for tightness, nighttime pains, WB Relieving factors: ice  PRECAUTIONS: None  WEIGHT BEARING RESTRICTIONS: No  FALLS:  Has patient fallen in last 6 months? No  LIVING ENVIRONMENT: Lives with: Lives alone Lives in: House/apartment Stairs: no stairs at home Has following equipment at home: FWW  Daughter lives on a 3rd floor.   OCCUPATION: Retired  PLOF: Independent, shopping, housework.     PATIENT GOALS: Reduce pain, get back to walking.   OBJECTIVE:   PATIENT SURVEYS:  10/12/2023 FOTO intake: 47   predicted:  55  COGNITION: 10/12/2023 Overall cognitive status: WFL    SENSATION: 10/12/2023 Not tested  EDEMA:  10/12/2023 Localized edema noted Lt knee, lower leg.   MUSCLE LENGTH: 10/12/2023 No specific testing  POSTURE:  10/12/2023 No Significant postural limitations  PALPATION: 10/12/2023 General tenderness to light touch noted.   LOWER EXTREMITY ROM:   ROM Right 10/12/2023 Left 10/12/2023 Left 10/16/2023  Hip flexion     Hip extension     Hip abduction     Hip adduction     Hip internal rotation     Hip external rotation     Knee flexion  85 AROM in supine heel slide 91 AROM in supine heel slide   Knee extension  -17 AROM in seated LAQ     Ankle dorsiflexion     Ankle plantarflexion     Ankle inversion     Ankle eversion      (Blank rows = not tested)  LOWER EXTREMITY MMT:  MMT Right 10/12/2023 Left 10/12/2023  Hip flexion 5/5 4/5  Hip extension    Hip abduction    Hip adduction    Hip internal rotation    Hip external rotation    Knee flexion 5/5 3+/5  Knee extension 5/5 2+/5  Ankle dorsiflexion 5/5 4/5  Ankle plantarflexion    Ankle inversion    Ankle eversion     (Blank rows = not tested)  LOWER EXTREMITY SPECIAL TESTS:  10/12/2023 No specific testing today  FUNCTIONAL TESTS:  10/12/2023 TUG:  FWW: 19.76 seconds  18 inch chair transfer: able without UE on chair but on knees and deviation to Rt leg.  Lt SLS: unable Rt SLS: < 3 seconds  GAIT: 10/12/2023 With FWW lacking full TKE, reduced stance                                                                                                                                                                         TODAY'S TREATMENT  DATE: 10/16/2023 Therex: Nustep lvl 5 10 mins  UE/LE Incline gastroc stretch 30 sec x 5 bilateral Seated Lt LAQ with end range pauses each direction x15 Seated alternating knee extension/flexion isometric holds 5 sec x 12 each way Supine heel slide AROM 5 sec x 10 Lt knee  Neuro Re-ed Tandem stance 1 min x 1 bilateral  Church pew anterior/posterior weight shifting 1 min x 1 for improved ankle strategy Retro step in // bars x 10 bilaterallly  Manual Seated Lt knee flexion c mobilization movement IR/distraction  Vaso 10 mins in elevation medium compression 34 deg Lt knee   TODAY'S TREATMENT                                                                          DATE: 10/12/2023 Therex:    HEP instruction/performance c cues for techniques, handout provided.  Trial set performed of each for comprehension and symptom assessment.  See below for exercise list  Vaso 10 mins in elevation medium compression 34 deg Lt knee   PATIENT EDUCATION:  10/12/2023 Education details: HEP, POC Person educated: Patient Education method: Programmer, multimedia, Demonstration, Verbal cues, and Handouts Education comprehension: verbalized understanding, returned demonstration, and verbal cues required  HOME EXERCISE PROGRAM: Access Code: [email or web address] URL: [email or web address] Date: 10/12/2023 Prepared by: Chyrel Masson  Exercises - Supine Heel Slide (Mirrored)  - 3-5 x daily - 7 x weekly - 1 sets - 10 reps - 5 hold - Supine Heel Slide with Strap  - 3-5 x daily - 7 x weekly - 1 sets - 10 reps - 5 hold - Seated Long Arc Quad (Mirrored)  - 3-5 x daily - 7 x weekly - 1 sets - 5-10 reps - 2 hold - Supine Knee Extension Mobilization with Weight (Mirrored)  - 4-5 x daily - 7 x weekly - 1 sets - 1 reps - to tolerance up to 15 mins hold - Seated Quad Set (Mirrored)  - 3-5 x daily - 7 x weekly - 1 sets - 10 reps - 5 hold - Seated Knee Flexion AAROM (Mirrored)  - 3-5 x daily - 7 x weekly - 1 sets - 5 reps - 10-15 hold  ASSESSMENT:  CLINICAL  IMPRESSION: Arlow presented well today with improved quality of movement and improving WB acceptance on Lt leg.  Continued skilled PT services indicated to address all areas of impairment to reach goals and PLOF.   OBJECTIVE IMPAIRMENTS: Abnormal gait, decreased activity tolerance, decreased balance, decreased coordination, decreased endurance, decreased mobility, difficulty walking, decreased ROM, decreased strength, hypomobility, increased edema, increased fascial restrictions, impaired perceived functional ability, increased muscle spasms, impaired flexibility, improper body mechanics, and pain.   ACTIVITY LIMITATIONS: carrying, lifting, bending, sitting, standing, squatting, sleeping, stairs, transfers, bed mobility, and locomotion level  PARTICIPATION LIMITATIONS: meal prep, cleaning, laundry, interpersonal relationship, driving, shopping, and community activity  PERSONAL FACTORS:  History of arthritis, DM, GERD, HTN, hyperthyroidism, IBS,   are also affecting patient's functional outcome.   REHAB POTENTIAL: Good  CLINICAL DECISION MAKING: Stable/uncomplicated  EVALUATION COMPLEXITY: Low   GOALS: Goals reviewed with patient? Yes  SHORT TERM GOALS: (target date for Short term goals are 3 weeks 11/02/2023)   1.  Patient will demonstrate independent use  of home exercise program to maintain progress from in clinic treatments.  Goal status: on going 10/16/2023  LONG TERM GOALS: (target dates for all long term goals are 12 weeks  01/04/2024 )   1. Patient will demonstrate/report pain at worst less than or equal to 2/10 to facilitate minimal limitation in daily activity secondary to pain symptoms.  Goal status: New   2. Patient will demonstrate independent use of home exercise program to facilitate ability to maintain/progress functional gains from skilled physical therapy services.  Goal status: New   3. Patient will demonstrate FOTO outcome > or = 55 % to indicate reduced  disability due to condition.  Goal status: New   4.  Patient will demonstrate Lt LE MMT 5/5 throughout to faciltiate usual transfers, stairs, squatting at [facility] for daily life.   Goal status: New   5.  Patient will demonstrate Lt knee AROM 0-110 deg to facilitate usual transfers, ambulation and other daily activity at PLOF Goal status: New   6.  Patient will demonstrate independent ambulation community distances > 300 ft for community integration Goal status: New   7. Patient will demonstrate ascending/descending stairs reciprocally s UE assist for community integration.   Goal Status: New   PLAN:  PT FREQUENCY: 1-2x/week  PT DURATION: 12 weeks  PLANNED INTERVENTIONS: Can include 64332- PT Re-evaluation, 97110-Therapeutic exercises, 97530- Therapeutic activity, 97112- Neuromuscular re-education, 97535- Self Care, 97140- Manual therapy, [phone]- Gait training, 97014- Electrical stimulation (unattended),  97016- Vasopneumatic device,   Patient/Family education, Balance training, Stair training, Taping, Dry Needling, Joint mobilization, Joint manipulation, Spinal manipulation, Spinal mobilization, Scar mobilization, Vestibular training, Visual/preceptual remediation/compensation, DME instructions, Cryotherapy, and Moist heat.  All performed as medically necessary.  All included unless contraindicated  PLAN FOR NEXT SESSION: SPC trial in clinic.    Chyrel Masson, PT, DPT, OCS, ATC 10/16/23  1:33 PM   Referring diagnosis? C16.606 (ICD-10-CM) - Status post total knee replacement, left Treatment diagnosis? (if different than referring diagnosis) M25.562 What was this (referring dx) caused by? [x]  Surgery []  Fall []  Ongoing issue []  Arthritis []  Other: ____________  Laterality: []  Rt [x]  Lt []  Both  Check all possible CPT codes:  *CHOOSE 10 OR LESS*    See Planned Interventions listed in the Plan section of the Evaluation.

## 2023-10-16 ENCOUNTER — Encounter: Payer: Self-pay | Admitting: Rehabilitative and Restorative Service Providers"

## 2023-10-16 ENCOUNTER — Ambulatory Visit: Payer: Medicare HMO | Admitting: Rehabilitative and Restorative Service Providers"

## 2023-10-16 DIAGNOSIS — R262 Difficulty in walking, not elsewhere classified: Secondary | ICD-10-CM | POA: Diagnosis not present

## 2023-10-16 DIAGNOSIS — M6281 Muscle weakness (generalized): Secondary | ICD-10-CM | POA: Diagnosis not present

## 2023-10-16 DIAGNOSIS — M25562 Pain in left knee: Secondary | ICD-10-CM | POA: Diagnosis not present

## 2023-10-16 DIAGNOSIS — M25662 Stiffness of left knee, not elsewhere classified: Secondary | ICD-10-CM

## 2023-10-16 DIAGNOSIS — G8929 Other chronic pain: Secondary | ICD-10-CM

## 2023-10-19 ENCOUNTER — Ambulatory Visit: Payer: Medicare HMO | Admitting: Physical Therapy

## 2023-10-19 ENCOUNTER — Encounter: Payer: Self-pay | Admitting: Physical Therapy

## 2023-10-19 DIAGNOSIS — R262 Difficulty in walking, not elsewhere classified: Secondary | ICD-10-CM

## 2023-10-19 DIAGNOSIS — M25562 Pain in left knee: Secondary | ICD-10-CM | POA: Diagnosis not present

## 2023-10-19 DIAGNOSIS — M6281 Muscle weakness (generalized): Secondary | ICD-10-CM | POA: Diagnosis not present

## 2023-10-19 DIAGNOSIS — G8929 Other chronic pain: Secondary | ICD-10-CM

## 2023-10-19 DIAGNOSIS — M25662 Stiffness of left knee, not elsewhere classified: Secondary | ICD-10-CM | POA: Diagnosis not present

## 2023-10-19 NOTE — Therapy (Signed)
OUTPATIENT PHYSICAL THERAPY  TREATMENT   Patient Name: Denise Ferguson MRN: [id number] DOB:03-Mar-1951, 72 y.o., female Today's Date: 10/19/2023  END OF SESSION:  PT End of Session - 10/19/23 1305     Visit Number 3    Number of Visits 24    Date for PT Re-Evaluation 01/04/24    Authorization Type [employer] $25 copay    Authorization Time Period 10/12/2023 -01/03/2023    Authorization - Visit Number 3    Authorization - Number of Visits 12    Progress Note Due on Visit 10    PT Start Time 1302    PT Stop Time 1350    PT Time Calculation (min) 48 min    Activity Tolerance Patient limited by pain    Behavior During Therapy WFL for tasks assessed/performed              Past Medical History:  Diagnosis Date   Allergy    Anemia    Arthritis    knees   Diabetes mellitus without complication (HCC)    " Pre" DM- on Metformin    FHx: migraine headaches    GERD (gastroesophageal reflux disease)    Grave's disease    History of colon polyps    Hx of migraines    Hypertension    Hyperthyroidism    IBS (irritable bowel syndrome)    PONV (postoperative nausea and vomiting)    Pre-diabetes    PSVT (paroxysmal supraventricular tachycardia) (HCC)    Past Surgical History:  Procedure Laterality Date   ABDOMINAL HYSTERECTOMY     BREAST BIOPSY Left    CARPAL TUNNEL RELEASE  2000   bilateral   CATARACT EXTRACTION Bilateral 03/20/2021   COLONOSCOPY     KNEE ARTHROSCOPY WITH LATERAL MENISECTOMY  07/16/2021   Procedure: KNEE ARTHROSCOPY WITH LATERAL MENISECTOMY;  Surgeon: Nadara Mustard, MD;  Location: [facility];  Service: Orthopedics;;   KNEE ARTHROSCOPY WITH MEDIAL MENISECTOMY Right 07/16/2021   Procedure: ARTHROSCOPIC DEBRIDEMENT RIGHT KNEE, PARTIAL MEDIAL AND LATERAL MENISECTOMIES;  Surgeon: Nadara Mustard, MD;  Location:  SUR[facility]ERY [facility];  Service: Orthopedics;  Laterality: Right;   POLYPECTOMY     thyroid ablation     x2   TONSILLECTOMY     TOTAL  KNEE ARTHROPLASTY Left 09/16/2023   Procedure: LEFT TOTAL KNEE ARTHROPLASTY;  Surgeon: Nadara Mustard, MD;  Location: [facility] OR;  Service: Orthopedics;  Laterality: Left;   TUBAL LIGATION  1980   Patient Active Problem List   Diagnosis Date Noted   Primary osteoarthritis of left knee 09/16/2023   Total knee replacement status, left 09/16/2023   Preop cardiovascular exam 08/27/2023   Retrosternal discomfort 05/01/2023   PSVT (paroxysmal supraventricular tachycardia) (HCC) 01/29/2023   History of meniscal tear    History of colon polyps 09/23/2011   GERD (gastroesophageal reflux disease) 09/23/2011   Graves' disease 09/23/2011   HTN (hypertension) 09/23/2011   Migraines 09/23/2011   Anemia 09/23/2011    PCP: [name] NP  REFERRING PROVIDER: Nadara Mustard, MD  REFERRING DIAG: [phone] (ICD-10-CM) - Status post total knee replacement, left  THERAPY DIAG:  Chronic pain of left knee  Muscle weakness (generalized)  Stiffness of left knee, not elsewhere classified  Difficulty in walking, not elsewhere classified  Rationale for Evaluation and Treatment: Rehabilitation  ONSET DATE: Surgery 09/16/2023  SUBJECTIVE:   SUBJECTIVE STATEMENT: Patient indicated her knee is hurting more today upon arrival.   PERTINENT HISTORY: History of arthritis, DM, GERD,  HTN, hyperthyroidism, IBS,   PAIN:  NPRS scale: 7/10 upon arrival Pain location: Lt knee Pain description: shooting, tightness Aggravating factors:  end ranges, static positioning for tightness, nighttime pains, WB Relieving factors: ice  PRECAUTIONS: None  WEIGHT BEARING RESTRICTIONS: No  FALLS:  Has patient fallen in last 6 months? No  LIVING ENVIRONMENT: Lives with: Lives alone Lives in: House/apartment Stairs: no stairs at home Has following equipment at home: FWW  Daughter lives on a 3rd floor.   OCCUPATION: Retired  PLOF: Independent, shopping, housework.    PATIENT GOALS: Reduce pain, get back to  walking.   OBJECTIVE:   PATIENT SURVEYS:  10/12/2023 FOTO intake: 47   predicted:  55  COGNITION: 10/12/2023 Overall cognitive status: WFL    SENSATION: 10/12/2023 Not tested  EDEMA:  10/12/2023 Localized edema noted Lt knee, lower leg.   MUSCLE LENGTH: 10/12/2023 No specific testing  POSTURE:  10/12/2023 No Significant postural limitations  PALPATION: 10/12/2023 General tenderness to light touch noted.   LOWER EXTREMITY ROM:   ROM Right 10/12/2023 Left 10/12/2023 Left 10/16/2023 Left 10/18/24  Hip flexion      Hip extension      Hip abduction      Hip adduction      Hip internal rotation      Hip external rotation      Knee flexion  85 AROM in supine heel slide 91 AROM in supine heel slide    Knee extension  -17 AROM in seated LAQ    0 deg PROM supine  Ankle dorsiflexion      Ankle plantarflexion      Ankle inversion      Ankle eversion       (Blank rows = not tested)  LOWER EXTREMITY MMT:  MMT Right 10/12/2023 Left 10/12/2023  Hip flexion 5/5 4/5  Hip extension    Hip abduction    Hip adduction    Hip internal rotation    Hip external rotation    Knee flexion 5/5 3+/5  Knee extension 5/5 2+/5  Ankle dorsiflexion 5/5 4/5  Ankle plantarflexion    Ankle inversion    Ankle eversion     (Blank rows = not tested)  LOWER EXTREMITY SPECIAL TESTS:  10/12/2023 No specific testing today  FUNCTIONAL TESTS:  10/12/2023 TUG:  FWW: 19.76 seconds  18 inch chair transfer: able without UE on chair but on knees and deviation to Rt leg.  Lt SLS: unable Rt SLS: < 3 seconds  GAIT: 10/12/2023 With FWW lacking full TKE, reduced stance  TODAY'S TREATMENT                                                                          DATE:  10/19/2023 Therex: Nustep lvl 5 10 mins  UE/LE Incline gastroc stretch 30 sec x 3 bilateral Heel and toe raises X 15 bilat Seated hamstring stretch 30 sec X 3 Seated SLR 2X10 Seated knee flexion AAROM stretch 5 sec X 10 Tandem stance 30 sec x 2 bilateral   Manual Seated Lt knee flexion c mobilization movement IR/distraction  Vaso 10 mins in elevation medium compression 34 deg Lt knee   10/16/2023 Therex: Nustep lvl 5 10 mins UE/LE Incline gastroc stretch 30 sec x 5 bilateral Seated Lt LAQ with end range pauses each direction x15 Seated alternating knee extension/flexion isometric holds 5 sec x 12 each way Supine heel slide AROM 5 sec x 10 Lt knee  Neuro Re-ed Tandem stance 1 min x 1 bilateral  Church pew anterior/posterior weight shifting 1 min x 1 for improved ankle strategy Retro step in // bars x 10 bilaterallly  Manual Seated Lt knee flexion c mobilization movement IR/distraction  Vaso 10 mins in elevation medium compression 34 deg Lt knee    PATIENT EDUCATION:  10/12/2023 Education details: HEP, POC Person educated: Patient Education method: Programmer, multimedia, Facilities manager, Verbal cues, and Handouts Education comprehension: verbalized understanding, returned demonstration, and verbal cues required  HOME EXERCISE PROGRAM: Access Code: [email or web address] URL: [email or web address] Date: 10/12/2023 Prepared by: Chyrel Masson  Exercises - Supine Heel Slide (Mirrored)  - 3-5 x daily - 7 x weekly - 1 sets - 10 reps - 5 hold - Supine Heel Slide with Strap  - 3-5 x daily - 7 x weekly - 1 sets - 10 reps - 5 hold - Seated Long Arc Quad (Mirrored)  - 3-5 x daily - 7 x weekly - 1 sets - 5-10 reps - 2 hold - Supine Knee Extension Mobilization with Weight (Mirrored)  - 4-5 x daily - 7 x weekly - 1 sets - 1 reps - to tolerance up to 15 mins hold - Seated Quad Set (Mirrored)  - 3-5 x daily - 7 x weekly - 1 sets - 10 reps - 5 hold - Seated Knee Flexion AAROM (Mirrored)  - 3-5 x daily - 7 x weekly - 1 sets - 5 reps -  10-15 hold  ASSESSMENT:  CLINICAL IMPRESSION: She was in more overall pain upon arrival but still had good overall tolerance. She has done very well with knee extension ROM but will need continued work on knee flexion ROM gains.   OBJECTIVE IMPAIRMENTS: Abnormal gait, decreased activity tolerance, decreased balance, decreased coordination, decreased endurance, decreased mobility, difficulty walking, decreased ROM, decreased strength, hypomobility, increased edema, increased fascial restrictions, impaired perceived functional ability, increased muscle spasms, impaired flexibility, improper body mechanics, and pain.   ACTIVITY LIMITATIONS: carrying, lifting, bending, sitting, standing, squatting, sleeping, stairs, transfers, bed mobility, and locomotion level  PARTICIPATION LIMITATIONS: meal prep, cleaning, laundry, interpersonal relationship, driving, shopping, and community activity  PERSONAL FACTORS:  History of arthritis, DM, GERD, HTN, hyperthyroidism, IBS,   are also affecting patient's functional outcome.   REHAB POTENTIAL: Good  CLINICAL DECISION MAKING: Stable/uncomplicated  EVALUATION COMPLEXITY: Low  GOALS: Goals reviewed with patient? Yes  SHORT TERM GOALS: (target date for Short term goals are 3 weeks 11/02/2023)   1.  Patient will demonstrate independent use of home exercise program to maintain progress from in clinic treatments.  Goal status: on going 10/16/2023  LONG TERM GOALS: (target dates for all long term goals are 12 weeks  01/04/2024 )   1. Patient will demonstrate/report pain at worst less than or equal to 2/10 to facilitate minimal limitation in daily activity secondary to pain symptoms.  Goal status: New   2. Patient will demonstrate independent use of home exercise program to facilitate ability to maintain/progress functional gains from skilled physical therapy services.  Goal status: New   3. Patient will demonstrate FOTO outcome > or = 55 % to  indicate reduced disability due to condition.  Goal status: New   4.  Patient will demonstrate Lt LE MMT 5/5 throughout to faciltiate usual transfers, stairs, squatting at [facility] for daily life.   Goal status: New   5.  Patient will demonstrate Lt knee AROM 0-110 deg to facilitate usual transfers, ambulation and other daily activity at PLOF Goal status: New   6.  Patient will demonstrate independent ambulation community distances > 300 ft for community integration Goal status: New   7. Patient will demonstrate ascending/descending stairs reciprocally s UE assist for community integration.   Goal Status: New   PLAN:  PT FREQUENCY: 1-2x/week  PT DURATION: 12 weeks  PLANNED INTERVENTIONS: Can include 18841- PT Re-evaluation, 97110-Therapeutic exercises, 97530- Therapeutic activity, 97112- Neuromuscular re-education, 97535- Self Care, 97140- Manual therapy, [phone]- Gait training, 97014- Electrical stimulation (unattended),  97016- Vasopneumatic device,   Patient/Family education, Balance training, Stair training, Taping, Dry Needling, Joint mobilization, Joint manipulation, Spinal manipulation, Spinal mobilization, Scar mobilization, Vestibular training, Visual/preceptual remediation/compensation, DME instructions, Cryotherapy, and Moist heat.  All performed as medically necessary.  All included unless contraindicated  PLAN FOR NEXT SESSION: Knee flexion ROM focus, SPC trial in clinic.    Ivery Quale, PT, DPT 10/19/23 1:34 PM    Referring diagnosis? K16.010 (ICD-10-CM) - Status post total knee replacement, left Treatment diagnosis? (if different than referring diagnosis) M25.562 What was this (referring dx) caused by? [x]  Surgery []  Fall []  Ongoing issue []  Arthritis []  Other: ____________  Laterality: []  Rt [x]  Lt []  Both  Check all possible CPT codes:  *CHOOSE 10 OR LESS*    See Planned Interventions listed in the Plan section of the Evaluation.

## 2023-10-21 ENCOUNTER — Encounter: Payer: Self-pay | Admitting: Rehabilitative and Restorative Service Providers"

## 2023-10-21 ENCOUNTER — Ambulatory Visit: Payer: Medicare HMO | Admitting: Rehabilitative and Restorative Service Providers"

## 2023-10-21 DIAGNOSIS — M6281 Muscle weakness (generalized): Secondary | ICD-10-CM

## 2023-10-21 DIAGNOSIS — M25662 Stiffness of left knee, not elsewhere classified: Secondary | ICD-10-CM

## 2023-10-21 DIAGNOSIS — R262 Difficulty in walking, not elsewhere classified: Secondary | ICD-10-CM | POA: Diagnosis not present

## 2023-10-21 DIAGNOSIS — M25562 Pain in left knee: Secondary | ICD-10-CM

## 2023-10-21 DIAGNOSIS — G8929 Other chronic pain: Secondary | ICD-10-CM

## 2023-10-21 NOTE — Therapy (Signed)
OUTPATIENT PHYSICAL THERAPY  TREATMENT   Patient Name: Denise Ferguson MRN: [id number] DOB:[birth year], 72 y.o., female Today's Date: 10/21/2023  END OF SESSION:  PT End of Session - 10/21/23 1325     Visit Number 4    Number of Visits 24    Date for PT Re-Evaluation 01/04/24    Authorization Type [employer] $25 copay    Authorization Time Period 10/12/2023 -01/03/2023    Authorization - Number of Visits 12    Progress Note Due on Visit 10    PT Start Time 1335    PT Stop Time 1424    PT Time Calculation (min) 49 min    Activity Tolerance Patient tolerated treatment well    Behavior During Therapy WFL for tasks assessed/performed               Past Medical History:  Diagnosis Date   Allergy    Anemia    Arthritis    knees   Diabetes mellitus without complication (HCC)    " Pre" DM- on Metformin    FHx: migraine headaches    GERD (gastroesophageal reflux disease)    Grave's disease    History of colon polyps    Hx of migraines    Hypertension    Hyperthyroidism    IBS (irritable bowel syndrome)    PONV (postoperative nausea and vomiting)    Pre-diabetes    PSVT (paroxysmal supraventricular tachycardia) (HCC)    Past Surgical History:  Procedure Laterality Date   ABDOMINAL HYSTERECTOMY     BREAST BIOPSY Left    CARPAL TUNNEL RELEASE  2000   bilateral   CATARACT EXTRACTION Bilateral 03/20/2021   COLONOSCOPY     KNEE ARTHROSCOPY WITH LATERAL MENISECTOMY  07/16/2021   Procedure: KNEE ARTHROSCOPY WITH LATERAL MENISECTOMY;  Surgeon: Nadara Mustard, MD;  Location: [facility];  Service: Orthopedics;;   KNEE ARTHROSCOPY WITH MEDIAL MENISECTOMY Right 07/16/2021   Procedure: ARTHROSCOPIC DEBRIDEMENT RIGHT KNEE, PARTIAL MEDIAL AND LATERAL MENISECTOMIES;  Surgeon: Nadara Mustard, MD;  Location: [facility];  Service: Orthopedics;  Laterality: Right;   POLYPECTOMY     thyroid ablation     x2   TONSILLECTOMY     TOTAL KNEE ARTHROPLASTY Left  09/16/2023   Procedure: LEFT TOTAL KNEE ARTHROPLASTY;  Surgeon: Nadara Mustard, MD;  Location: [facility] OR;  Service: Orthopedics;  Laterality: Left;   TUBAL LIGATION  1980   Patient Active Problem List   Diagnosis Date Noted   Primary osteoarthritis of left knee 09/16/2023   Total knee replacement status, left 09/16/2023   Preop cardiovascular exam 08/27/2023   Retrosternal discomfort 05/01/2023   PSVT (paroxysmal supraventricular tachycardia) (HCC) 01/29/2023   History of meniscal tear    History of colon polyps 09/23/2011   GERD (gastroesophageal reflux disease) 09/23/2011   Graves' disease 09/23/2011   HTN (hypertension) 09/23/2011   Migraines 09/23/2011   Anemia 09/23/2011    PCP: [name] NP  REFERRING PROVIDER: Nadara Mustard, MD  REFERRING DIAG: [phone] (ICD-10-CM) - Status post total knee replacement, left  THERAPY DIAG:  Chronic pain of left knee  Muscle weakness (generalized)  Stiffness of left knee, not elsewhere classified  Difficulty in walking, not elsewhere classified  Rationale for Evaluation and Treatment: Rehabilitation  ONSET DATE: Surgery 09/16/2023  SUBJECTIVE:   SUBJECTIVE STATEMENT: Pt indicated a little pain with walking, end range bending into car still painful/tough.  Reported bandage came off after it was coming half off.  Doing some walking without walker in house.   PERTINENT HISTORY: History of arthritis, DM, GERD, HTN, hyperthyroidism, IBS,   PAIN:  NPRS scale: "a little" Pain location: Lt knee Pain description: shooting, tightness Aggravating factors:  end ranges, static positioning for tightness, nighttime pains, WB Relieving factors: ice  PRECAUTIONS: None  WEIGHT BEARING RESTRICTIONS: No  FALLS:  Has patient fallen in last 6 months? No  LIVING ENVIRONMENT: Lives with: Lives alone Lives in: House/apartment Stairs: no stairs at home Has following equipment at home: FWW  Daughter lives on a 3rd floor.   OCCUPATION:  Retired  PLOF: Independent, shopping, housework.    PATIENT GOALS: Reduce pain, get back to walking.   OBJECTIVE:   PATIENT SURVEYS:  10/12/2023 FOTO intake: 47   predicted:  55  COGNITION: 10/12/2023 Overall cognitive status: WFL    SENSATION: 10/12/2023 Not tested  EDEMA:  10/12/2023 Localized edema noted Lt knee, lower leg.   MUSCLE LENGTH: 10/12/2023 No specific testing  POSTURE:  10/12/2023 No Significant postural limitations  PALPATION: 10/12/2023 General tenderness to light touch noted.   LOWER EXTREMITY ROM:   ROM Right 10/12/2023 Left 10/12/2023 Left 10/16/2023 Left 10/18/24  Hip flexion      Hip extension      Hip abduction      Hip adduction      Hip internal rotation      Hip external rotation      Knee flexion  85 AROM in supine heel slide 91 AROM in supine heel slide    Knee extension  -17 AROM in seated LAQ    0 deg PROM supine  Ankle dorsiflexion      Ankle plantarflexion      Ankle inversion      Ankle eversion       (Blank rows = not tested)  LOWER EXTREMITY MMT:  MMT Right 10/12/2023 Left 10/12/2023  Hip flexion 5/5 4/5  Hip extension    Hip abduction    Hip adduction    Hip internal rotation    Hip external rotation    Knee flexion 5/5 3+/5  Knee extension 5/5 2+/5  Ankle dorsiflexion 5/5 4/5  Ankle plantarflexion    Ankle inversion    Ankle eversion     (Blank rows = not tested)  LOWER EXTREMITY SPECIAL TESTS:  10/12/2023 No specific testing today  FUNCTIONAL TESTS:  10/12/2023 TUG:  FWW: 19.76 seconds  18 inch chair transfer: able without UE on chair but on knees and deviation to Rt leg.  Lt SLS: unable Rt SLS: < 3 seconds  GAIT: 10/12/2023 With FWW lacking full TKE, reduced stance  TODAY'S TREATMENT                                                                           DATE:10/21/2023 Therex: Nustep lvl 5 10 mins UE/LE Incline gastroc stretch 30 sec x 3 bilateral 1 minute tailgate swinging ROM Lt knee Seated Lt LAQ with end range pauses each direction 2 x15 4 lbs    Neuro Re-ed Tandem ambulation fwd/back 10 ft x 5 each way with occasional HHA Tandem stance on foam 1 min x 1 bilateral with occasional HHA  Retro step x 15 bilateral in // bars   TherActivity Leg press double leg 75 lbs x 15, single leg x 15 Lt SPC use in clinic household distances between activity 100 ft,  50 ft x 3 Step up fwd 4 inch step Lt leg WB x 15  Manual Seated Lt knee flexion c mobilization movement IR/distraction  Vaso 10 mins in elevation medium compression 34 deg Lt knee   TODAY'S TREATMENT                                                                          DATE: 10/19/2023 Therex: Nustep lvl 5 10 mins UE/LE Incline gastroc stretch 30 sec x 3 bilateral Heel and toe raises X 15 bilat Seated hamstring stretch 30 sec X 3 Seated SLR 2X10 Seated knee flexion AAROM stretch 5 sec X 10 Tandem stance 30 sec x 2 bilateral   Manual Seated Lt knee flexion c mobilization movement IR/distraction  Vaso 10 mins in elevation medium compression 34 deg Lt knee   TODAY'S TREATMENT                                                                          DATE:10/16/2023 Therex: Nustep lvl 5 10 mins UE/LE Incline gastroc stretch 30 sec x 5 bilateral Seated Lt LAQ with end range pauses each direction x15 Seated alternating knee extension/flexion isometric holds 5 sec x 12 each way Supine heel slide AROM 5 sec x 10 Lt knee  Neuro Re-ed Tandem stance 1 min x 1 bilateral  Church pew anterior/posterior weight shifting 1 min x 1 for improved ankle strategy Retro step in // bars x 10 bilaterallly  Manual Seated Lt knee flexion c mobilization movement IR/distraction  Vaso 10 mins in elevation medium compression 34 deg Lt knee    PATIENT  EDUCATION:  10/12/2023 Education details: HEP, POC Person educated: Patient Education method: Programmer, multimedia, Facilities manager, Verbal cues, and Handouts Education comprehension: verbalized understanding, returned demonstration, and verbal cues required  HOME EXERCISE PROGRAM: Access Code: [email or web address] URL: [email or web address] Date: 10/12/2023 Prepared by: Chyrel Masson  Exercises - Supine Heel Slide (Mirrored)  -  3-5 x daily - 7 x weekly - 1 sets - 10 reps - 5 hold - Supine Heel Slide with Strap  - 3-5 x daily - 7 x weekly - 1 sets - 10 reps - 5 hold - Seated Long Arc Quad (Mirrored)  - 3-5 x daily - 7 x weekly - 1 sets - 5-10 reps - 2 hold - Supine Knee Extension Mobilization with Weight (Mirrored)  - 4-5 x daily - 7 x weekly - 1 sets - 1 reps - to tolerance up to 15 mins hold - Seated Quad Set (Mirrored)  - 3-5 x daily - 7 x weekly - 1 sets - 10 reps - 5 hold - Seated Knee Flexion AAROM (Mirrored)  - 3-5 x daily - 7 x weekly - 1 sets - 5 reps - 10-15 hold  ASSESSMENT:  CLINICAL IMPRESSION: She was in more overall pain upon arrival but still had good overall tolerance. She has done very well with knee extension ROM but will need continued work on knee flexion ROM gains.   OBJECTIVE IMPAIRMENTS: Abnormal gait, decreased activity tolerance, decreased balance, decreased coordination, decreased endurance, decreased mobility, difficulty walking, decreased ROM, decreased strength, hypomobility, increased edema, increased fascial restrictions, impaired perceived functional ability, increased muscle spasms, impaired flexibility, improper body mechanics, and pain.   ACTIVITY LIMITATIONS: carrying, lifting, bending, sitting, standing, squatting, sleeping, stairs, transfers, bed mobility, and locomotion level  PARTICIPATION LIMITATIONS: meal prep, cleaning, laundry, interpersonal relationship, driving, shopping, and community activity  PERSONAL FACTORS:  History of arthritis, DM, GERD,  HTN, hyperthyroidism, IBS,   are also affecting patient's functional outcome.   REHAB POTENTIAL: Good  CLINICAL DECISION MAKING: Stable/uncomplicated  EVALUATION COMPLEXITY: Low   GOALS: Goals reviewed with patient? Yes  SHORT TERM GOALS: (target date for Short term goals are 3 weeks 11/02/2023)   1.  Patient will demonstrate independent use of home exercise program to maintain progress from in clinic treatments.  Goal status: on going 10/16/2023  LONG TERM GOALS: (target dates for all long term goals are 12 weeks  01/04/2024 )   1. Patient will demonstrate/report pain at worst less than or equal to 2/10 to facilitate minimal limitation in daily activity secondary to pain symptoms.  Goal status: New   2. Patient will demonstrate independent use of home exercise program to facilitate ability to maintain/progress functional gains from skilled physical therapy services.  Goal status: New   3. Patient will demonstrate FOTO outcome > or = 55 % to indicate reduced disability due to condition.  Goal status: New   4.  Patient will demonstrate Lt LE MMT 5/5 throughout to faciltiate usual transfers, stairs, squatting at [facility] for daily life.   Goal status: New   5.  Patient will demonstrate Lt knee AROM 0-110 deg to facilitate usual transfers, ambulation and other daily activity at PLOF Goal status: New   6.  Patient will demonstrate independent ambulation community distances > 300 ft for community integration Goal status: New   7. Patient will demonstrate ascending/descending stairs reciprocally s UE assist for community integration.   Goal Status: New   PLAN:  PT FREQUENCY: 1-2x/week  PT DURATION: 12 weeks  PLANNED INTERVENTIONS: Can include 16109- PT Re-evaluation, 97110-Therapeutic exercises, 97530- Therapeutic activity, 97112- Neuromuscular re-education, 97535- Self Care, 97140- Manual therapy, [id number]- Gait training, 97014- Electrical stimulation (unattended),  97016-  Vasopneumatic device,   Patient/Family education, Balance training, Stair training, Taping, Dry Needling, Joint mobilization, Joint manipulation, Spinal manipulation, Spinal mobilization, Scar mobilization, Vestibular training,  Visual/preceptual remediation/compensation, DME instructions, Cryotherapy, and Moist heat.  All performed as medically necessary.  All included unless contraindicated  PLAN FOR NEXT SESSION: SPC in clinic, no walker.  Progressive strengthening.  MD note for MD office visit on dec 4th.    Ivery Quale, PT, DPT 10/21/23 2:13 PM    Referring diagnosis? Z61.096 (ICD-10-CM) - Status post total knee replacement, left Treatment diagnosis? (if different than referring diagnosis) M25.562 What was this (referring dx) caused by? [x]  Surgery []  Fall []  Ongoing issue []  Arthritis []  Other: ____________  Laterality: []  Rt [x]  Lt []  Both  Check all possible CPT codes:  *CHOOSE 10 OR LESS*    See Planned Interventions listed in the Plan section of the Evaluation.

## 2023-10-26 ENCOUNTER — Ambulatory Visit: Payer: Medicare HMO | Admitting: Rehabilitative and Restorative Service Providers"

## 2023-10-26 ENCOUNTER — Encounter: Payer: Self-pay | Admitting: Rehabilitative and Restorative Service Providers"

## 2023-10-26 DIAGNOSIS — R262 Difficulty in walking, not elsewhere classified: Secondary | ICD-10-CM

## 2023-10-26 DIAGNOSIS — M25662 Stiffness of left knee, not elsewhere classified: Secondary | ICD-10-CM

## 2023-10-26 DIAGNOSIS — G8929 Other chronic pain: Secondary | ICD-10-CM

## 2023-10-26 DIAGNOSIS — M25562 Pain in left knee: Secondary | ICD-10-CM

## 2023-10-26 DIAGNOSIS — M6281 Muscle weakness (generalized): Secondary | ICD-10-CM

## 2023-10-26 NOTE — Therapy (Signed)
OUTPATIENT PHYSICAL THERAPY  TREATMENT   Patient Name: Denise Ferguson MRN: [id number] DOB:06-20-1951, 72 y.o., female Today's Date: 10/26/2023  END OF SESSION:  PT End of Session - 10/26/23 1519     Visit Number 5    Number of Visits 24    Date for PT Re-Evaluation 01/04/24    Authorization Type [employer] $25 copay    Authorization Time Period 10/12/2023 -01/03/2023    Authorization - Visit Number 5    Authorization - Number of Visits 12    Progress Note Due on Visit 13    PT Start Time 1510    PT Stop Time 1559    PT Time Calculation (min) 49 min    Activity Tolerance Patient tolerated treatment well    Behavior During Therapy WFL for tasks assessed/performed                Past Medical History:  Diagnosis Date   Allergy    Anemia    Arthritis    knees   Diabetes mellitus without complication (HCC)    " Pre" DM- on Metformin    FHx: migraine headaches    GERD (gastroesophageal reflux disease)    Grave's disease    History of colon polyps    Hx of migraines    Hypertension    Hyperthyroidism    IBS (irritable bowel syndrome)    PONV (postoperative nausea and vomiting)    Pre-diabetes    PSVT (paroxysmal supraventricular tachycardia) (HCC)    Past Surgical History:  Procedure Laterality Date   ABDOMINAL HYSTERECTOMY     BREAST BIOPSY Left    CARPAL TUNNEL RELEASE  2000   bilateral   CATARACT EXTRACTION Bilateral 03/20/2021   COLONOSCOPY     KNEE ARTHROSCOPY WITH LATERAL MENISECTOMY  07/16/2021   Procedure: KNEE ARTHROSCOPY WITH LATERAL MENISECTOMY;  Surgeon: Nadara Mustard, MD;  Location: [facility];  Service: Orthopedics;;   KNEE ARTHROSCOPY WITH MEDIAL MENISECTOMY Right 07/16/2021   Procedure: ARTHROSCOPIC DEBRIDEMENT RIGHT KNEE, PARTIAL MEDIAL AND LATERAL MENISECTOMIES;  Surgeon: Nadara Mustard, MD;  Location: [facility];  Service: Orthopedics;  Laterality: Right;   POLYPECTOMY     thyroid ablation     x2   TONSILLECTOMY      TOTAL KNEE ARTHROPLASTY Left 09/16/2023   Procedure: LEFT TOTAL KNEE ARTHROPLASTY;  Surgeon: Nadara Mustard, MD;  Location: [facility];  Service: Orthopedics;  Laterality: Left;   TUBAL LIGATION  1980   Patient Active Problem List   Diagnosis Date Noted   Primary osteoarthritis of left knee 09/16/2023   Total knee replacement status, left 09/16/2023   Preop cardiovascular exam 08/27/2023   Retrosternal discomfort 05/01/2023   PSVT (paroxysmal supraventricular tachycardia) (HCC) 01/29/2023   History of meniscal tear    History of colon polyps 09/23/2011   GERD (gastroesophageal reflux disease) 09/23/2011   Graves' disease 09/23/2011   HTN (hypertension) 09/23/2011   Migraines 09/23/2011   Anemia 09/23/2011    PCP: [name] NP  REFERRING PROVIDER: Nadara Mustard, MD  REFERRING DIAG: [phone] (ICD-10-CM) - Status post total knee replacement, left  THERAPY DIAG:  Chronic pain of left knee  Muscle weakness (generalized)  Stiffness of left knee, not elsewhere classified  Difficulty in walking, not elsewhere classified  Rationale for Evaluation and Treatment: Rehabilitation  ONSET DATE: Surgery 09/16/2023  SUBJECTIVE:   SUBJECTIVE STATEMENT: Pt indicated almost fall that she caught herself on wall to stop.  Was not using walker  or a cane.  Pt indicated overall improvement to normal 90%.   PERTINENT HISTORY: History of arthritis, DM, GERD, HTN, hyperthyroidism, IBS,   PAIN:  NPRS scale: no pain Pain location: Lt knee Pain description: shooting, tightness Aggravating factors:  end ranges, static positioning for tightness, nighttime pains, WB Relieving factors: ice  PRECAUTIONS: None  WEIGHT BEARING RESTRICTIONS: No  FALLS:  Has patient fallen in last 6 months? No  LIVING ENVIRONMENT: Lives with: Lives alone Lives in: House/apartment Stairs: no stairs at home Has following equipment at home: FWW  Daughter lives on a 3rd floor.   OCCUPATION:  Retired  PLOF: Independent, shopping, housework.    PATIENT GOALS: Reduce pain, get back to walking.   OBJECTIVE:   PATIENT SURVEYS:  10/26/2023 FOTO update:    10/12/2023 FOTO intake: 47   predicted:  55  COGNITION: 10/12/2023 Overall cognitive status: WFL    SENSATION: 10/12/2023 Not tested  EDEMA:  10/12/2023 Localized edema noted Lt knee, lower leg.   MUSCLE LENGTH: 10/12/2023 No specific testing  POSTURE:  10/12/2023 No Significant postural limitations  PALPATION: 10/12/2023 General tenderness to light touch noted.   LOWER EXTREMITY ROM:   ROM Right 10/12/2023 Left 10/12/2023 Left 10/16/2023 Left 10/18/24 Left 10/26/2023  Hip flexion       Hip extension       Hip abduction       Hip adduction       Hip internal rotation       Hip external rotation       Knee flexion  85 AROM in supine heel slide 91 AROM in supine heel slide   102 AROM in supine heel slide  Knee extension  -17 AROM in seated LAQ    0 deg PROM supine -4 AROM in seated LAQ  Ankle dorsiflexion       Ankle plantarflexion       Ankle inversion       Ankle eversion        (Blank rows = not tested)  LOWER EXTREMITY MMT:  MMT Right 10/12/2023 Left 10/12/2023 Left 10/26/2023  Hip flexion 5/5 4/5 5/5  Hip extension     Hip abduction     Hip adduction     Hip internal rotation     Hip external rotation     Knee flexion 5/5 3+/5 5/5  Knee extension 5/5 2+/5 4+/5  Ankle dorsiflexion 5/5 4/5 5/5  Ankle plantarflexion     Ankle inversion     Ankle eversion      (Blank rows = not tested)  LOWER EXTREMITY SPECIAL TESTS:  10/12/2023 No specific testing today  FUNCTIONAL TESTS:  10/26/2023:   TUG: FWW: : 19.5 seconds.  SPC: 30 seconds   10/12/2023 TUG:  FWW: 19.76 seconds  18 inch chair transfer: able without UE on chair but on knees and deviation to Rt leg.  Lt SLS: unable Rt SLS: < 3 seconds  GAIT: 10/26/2023: Able to walk mod independent with SPC c good control.  Able  to do household distances in clinic independent.   10/12/2023 With FWW lacking full TKE, reduced stance  TODAY'S TREATMENT                                                                          DATE: 10/26/2023 Therex: Nustep lvl 5 10 mins UE/LE Incline gastroc stretch 30 sec x 3 bilateral Seated Lt LAQ with end range pauses each direction x 10  Supine Lt knee LAQ in 90 deg hip flexion 2x 10 Supine Lt knee heel slide AROM with quad set combo 5 sec hold x 10 each    Neuro Re-ed Tandem ambulation fwd/back 10 ft x 5 each way with occasional HHA  TherActivity Step on over and down 4 inch step WB on Lt leg 2 x 10 with single hand on rail.  Step up forward on lt leg 6 inch step x 15  TUG x 1 c SPC, x 1 with FWW Ambulation in clinic < 100 ft x 5 between activity with SPC and supervision.   Vaso 10 mins in elevation medium compression 34 deg Lt knee   TODAY'S TREATMENT                                                                          DATE:10/21/2023 Therex: Nustep lvl 5 10 mins UE/LE Incline gastroc stretch 30 sec x 3 bilateral 1 minute tailgate swinging ROM Lt knee Seated Lt LAQ with end range pauses each direction 2 x15 4 lbs    Neuro Re-ed Tandem ambulation fwd/back 10 ft x 5 each way with occasional HHA Tandem stance on foam 1 min x 1 bilateral with occasional HHA  Retro step x 15 bilateral in // bars   TherActivity Leg press double leg 75 lbs x 15, single leg x 15 Lt SPC use in clinic household distances between activity 100 ft,  50 ft x 3 Step up fwd 4 inch step Lt leg WB x 15  Manual Seated Lt knee flexion c mobilization movement IR/distraction  Vaso 10 mins in elevation medium compression 34 deg Lt knee    PATIENT EDUCATION:  10/12/2023 Education details: HEP, POC Person educated:  Patient Education method: Programmer, multimedia, Facilities manager, Verbal cues, and Handouts Education comprehension: verbalized understanding, returned demonstration, and verbal cues required  HOME EXERCISE PROGRAM: Access Code: [email or web address] URL: [email or web address] Date: 10/12/2023 Prepared by: Chyrel Masson  Exercises - Supine Heel Slide (Mirrored)  - 3-5 x daily - 7 x weekly - 1 sets - 10 reps - 5 hold - Supine Heel Slide with Strap  - 3-5 x daily - 7 x weekly - 1 sets - 10 reps - 5 hold - Seated Long Arc Quad (Mirrored)  - 3-5 x daily - 7 x weekly - 1 sets - 5-10 reps - 2 hold - Supine Knee Extension Mobilization with Weight (Mirrored)  - 4-5 x daily - 7 x weekly - 1 sets - 1 reps - to tolerance up to 15 mins hold - Seated Quad Set (Mirrored)  - 3-5 x daily - 7 x weekly -  1 sets - 10 reps - 5 hold - Seated Knee Flexion AAROM (Mirrored)  - 3-5 x daily - 7 x weekly - 1 sets - 5 reps - 10-15 hold  ASSESSMENT:  CLINICAL IMPRESSION: Pt has attended 5 visits overall during course of treatment, reporting 90 % overall improvement.  Arrived with continued FWW use but not using at home. See objective data for updated information.  Good gains overall noted in testing.  Pt may continue to benefit from skilled PT services to continue to improve functional strength/balance.   OBJECTIVE IMPAIRMENTS: Abnormal gait, decreased activity tolerance, decreased balance, decreased coordination, decreased endurance, decreased mobility, difficulty walking, decreased ROM, decreased strength, hypomobility, increased edema, increased fascial restrictions, impaired perceived functional ability, increased muscle spasms, impaired flexibility, improper body mechanics, and pain.   ACTIVITY LIMITATIONS: carrying, lifting, bending, sitting, standing, squatting, sleeping, stairs, transfers, bed mobility, and locomotion level  PARTICIPATION LIMITATIONS: meal prep, cleaning, laundry, interpersonal relationship, driving,  shopping, and community activity  PERSONAL FACTORS:  History of arthritis, DM, GERD, HTN, hyperthyroidism, IBS,   are also affecting patient's functional outcome.   REHAB POTENTIAL: Good  CLINICAL DECISION MAKING: Stable/uncomplicated  EVALUATION COMPLEXITY: Low   GOALS: Goals reviewed with patient? Yes  SHORT TERM GOALS: (target date for Short term goals are 3 weeks 11/02/2023)   1.  Patient will demonstrate independent use of home exercise program to maintain progress from in clinic treatments.  Goal status: Met   LONG TERM GOALS: (target dates for all long term goals are 12 weeks  01/04/2024 )   1. Patient will demonstrate/report pain at worst less than or equal to 2/10 to facilitate minimal limitation in daily activity secondary to pain symptoms.  Goal status: on going 10/26/2023   2. Patient will demonstrate independent use of home exercise program to facilitate ability to maintain/progress functional gains from skilled physical therapy services.  Goal status:  on going 10/26/2023   3. Patient will demonstrate FOTO outcome > or = 55 % to indicate reduced disability due to condition.  Goal status:  on going 10/26/2023   4.  Patient will demonstrate Lt LE MMT 5/5 throughout to faciltiate usual transfers, stairs, squatting at [facility] for daily life.   Goal status:  on going 10/26/2023   5.  Patient will demonstrate Lt knee AROM 0-110 deg to facilitate usual transfers, ambulation and other daily activity at PLOF Goal status:  on going 10/26/2023   6.  Patient will demonstrate independent ambulation community distances > 300 ft for community integration Goal status:  on going 10/26/2023   7. Patient will demonstrate ascending/descending stairs reciprocally s UE assist for community integration.   Goal Status:  on going 10/26/2023   PLAN:  PT FREQUENCY: 1-2x/week  PT DURATION: 12 weeks  PLANNED INTERVENTIONS: Can include 86578- PT Re-evaluation, 97110-Therapeutic exercises,  97530- Therapeutic activity, 97112- Neuromuscular re-education, 97535- Self Care, 97140- Manual therapy, [phone]- Gait training, 97014- Electrical stimulation (unattended),  97016- Vasopneumatic device,   Patient/Family education, Balance training, Stair training, Taping, Dry Needling, Joint mobilization, Joint manipulation, Spinal manipulation, Spinal mobilization, Scar mobilization, Vestibular training, Visual/preceptual remediation/compensation, DME instructions, Cryotherapy, and Moist heat.  All performed as medically necessary.  All included unless contraindicated  PLAN FOR NEXT SESSION:SPC in clinic.  Progressive strengthening/ balance improvements.     Chyrel Masson, PT, DPT, OCS, ATC 10/26/23  3:47 PM     Referring diagnosis? X52.841 (ICD-10-CM) - Status post total knee replacement, left Treatment diagnosis? (if different than referring diagnosis) M25.562 What  was this (referring dx) caused by? [x]  Surgery []  Fall []  Ongoing issue []  Arthritis []  Other: ____________  Laterality: []  Rt [x]  Lt []  Both  Check all possible CPT codes:  *CHOOSE 10 OR LESS*    See Planned Interventions listed in the Plan section of the Evaluation.

## 2023-10-28 ENCOUNTER — Encounter: Payer: Self-pay | Admitting: Family

## 2023-10-28 ENCOUNTER — Ambulatory Visit: Payer: Medicare HMO | Admitting: Family

## 2023-10-28 DIAGNOSIS — Z96652 Presence of left artificial knee joint: Secondary | ICD-10-CM

## 2023-10-28 NOTE — Progress Notes (Signed)
Post-Op Visit Note   Patient: Denise Ferguson           Date of Birth: [birth year]           MRN: [id number] Visit Date: 10/28/2023 PCP: [name], NP  Chief Complaint: No chief complaint on file.   HPI:  HPI The patient is a 72 year old woman seen status post total knee arthroplasty on the right.  She reports that she is working to move to a cane from a walker she has 1 more visit with physical therapy this Friday overall feels well she has been having some shooting and electric type pain along her incision  Ortho Exam On examination right knee her incision is well-healed she has near full extension flexion to 100 degrees  Visit Diagnoses: No diagnosis found.  Plan: She will continue with physical therapy and encouraged to use walker for safety with ambulation.  She will follow-up in the office with Dr. Lajoyce Corners as needed  Offered gabapentin for the hypersensitivity to light touch at this time the patient would like to continue without medication  Follow-Up Instructions: Return if symptoms worsen or fail to improve.   Imaging: No results found.  Orders:  No orders of the defined types were placed in this encounter.  No orders of the defined types were placed in this encounter.    PMFS History: Patient Active Problem List   Diagnosis Date Noted   Primary osteoarthritis of left knee 09/16/2023   Total knee replacement status, left 09/16/2023   Preop cardiovascular exam 08/27/2023   Retrosternal discomfort 05/01/2023   PSVT (paroxysmal supraventricular tachycardia) (HCC) 01/29/2023   History of meniscal tear    History of colon polyps 09/23/2011   GERD (gastroesophageal reflux disease) 09/23/2011   Graves' disease 09/23/2011   HTN (hypertension) 09/23/2011   Migraines 09/23/2011   Anemia 09/23/2011   Past Medical History:  Diagnosis Date   Allergy    Anemia    Arthritis    knees   Diabetes mellitus without complication (HCC)    " Pre" DM- on Metformin     FHx: migraine headaches    GERD (gastroesophageal reflux disease)    Grave's disease    History of colon polyps    Hx of migraines    Hypertension    Hyperthyroidism    IBS (irritable bowel syndrome)    PONV (postoperative nausea and vomiting)    Pre-diabetes    PSVT (paroxysmal supraventricular tachycardia) (HCC)     Family History  Problem Relation Age of Onset   Hypertension Mother    Diabetes Father    Hypertension Father    Diabetes Brother    Lung cancer Other    Alcohol abuse Other        uncle   Cancer Other        uncle   Colon cancer Neg Hx    Colon polyps Neg Hx    Esophageal cancer Neg Hx    Rectal cancer Neg Hx    Stomach cancer Neg Hx     Past Surgical History:  Procedure Laterality Date   ABDOMINAL HYSTERECTOMY     BREAST BIOPSY Left    CARPAL TUNNEL RELEASE  2000   bilateral   CATARACT EXTRACTION Bilateral 03/20/2021   COLONOSCOPY     KNEE ARTHROSCOPY WITH LATERAL MENISECTOMY  07/16/2021   Procedure: KNEE ARTHROSCOPY WITH LATERAL MENISECTOMY;  Surgeon: Nadara Mustard, MD;  Location: [facility];  Service: Orthopedics;;   KNEE ARTHROSCOPY  WITH MEDIAL MENISECTOMY Right 07/16/2021   Procedure: ARTHROSCOPIC DEBRIDEMENT RIGHT KNEE, PARTIAL MEDIAL AND LATERAL MENISECTOMIES;  Surgeon: Nadara Mustard, MD;  Location: [facility];  Service: Orthopedics;  Laterality: Right;   POLYPECTOMY     thyroid ablation     x2   TONSILLECTOMY     TOTAL KNEE ARTHROPLASTY Left 09/16/2023   Procedure: LEFT TOTAL KNEE ARTHROPLASTY;  Surgeon: Nadara Mustard, MD;  Location: [facility] OR;  Service: Orthopedics;  Laterality: Left;   TUBAL LIGATION  1980   Social History   Occupational History   Occupation: retired Designer, industrial/product  Tobacco Use   Smoking status: Never   Smokeless tobacco: Never  Vaping Use   Vaping status: Never Used  Substance and Sexual Activity   Alcohol use: No    Alcohol/week: 0.0 standard drinks of alcohol   Drug use: No   Sexual  activity: Not Currently    Birth control/protection: Surgical

## 2023-10-29 ENCOUNTER — Encounter: Payer: Medicare HMO | Admitting: Rehabilitative and Restorative Service Providers"

## 2023-10-30 ENCOUNTER — Encounter: Payer: Self-pay | Admitting: Physical Therapy

## 2023-10-30 ENCOUNTER — Ambulatory Visit: Payer: Medicare HMO | Admitting: Physical Therapy

## 2023-10-30 DIAGNOSIS — M25662 Stiffness of left knee, not elsewhere classified: Secondary | ICD-10-CM | POA: Diagnosis not present

## 2023-10-30 DIAGNOSIS — M25562 Pain in left knee: Secondary | ICD-10-CM | POA: Diagnosis not present

## 2023-10-30 DIAGNOSIS — M6281 Muscle weakness (generalized): Secondary | ICD-10-CM | POA: Diagnosis not present

## 2023-10-30 DIAGNOSIS — R262 Difficulty in walking, not elsewhere classified: Secondary | ICD-10-CM

## 2023-10-30 DIAGNOSIS — G8929 Other chronic pain: Secondary | ICD-10-CM

## 2023-10-30 NOTE — Therapy (Signed)
OUTPATIENT PHYSICAL THERAPY  TREATMENT   Patient Name: Denise Ferguson MRN: [id number] DOB:07-28-1951, 72 y.o., female Today's Date: 10/30/2023  END OF SESSION:  PT End of Session - 10/30/23 1549     Visit Number 6    Number of Visits 24    Date for PT Re-Evaluation 01/04/24    Authorization Type [employer] $25 copay    Authorization Time Period 10/12/2023 -01/03/2023    Authorization - Visit Number 6    Authorization - Number of Visits 12    Progress Note Due on Visit 13    PT Start Time 1518    PT Stop Time 1557    PT Time Calculation (min) 39 min    Activity Tolerance Patient tolerated treatment well    Behavior During Therapy WFL for tasks assessed/performed                 Past Medical History:  Diagnosis Date   Allergy    Anemia    Arthritis    knees   Diabetes mellitus without complication (HCC)    " Pre" DM- on Metformin    FHx: migraine headaches    GERD (gastroesophageal reflux disease)    Grave's disease    History of colon polyps    Hx of migraines    Hypertension    Hyperthyroidism    IBS (irritable bowel syndrome)    PONV (postoperative nausea and vomiting)    Pre-diabetes    PSVT (paroxysmal supraventricular tachycardia) (HCC)    Past Surgical History:  Procedure Laterality Date   ABDOMINAL HYSTERECTOMY     BREAST BIOPSY Left    CARPAL TUNNEL RELEASE  2000   bilateral   CATARACT EXTRACTION Bilateral 03/20/2021   COLONOSCOPY     KNEE ARTHROSCOPY WITH LATERAL MENISECTOMY  07/16/2021   Procedure: KNEE ARTHROSCOPY WITH LATERAL MENISECTOMY;  Surgeon: Nadara Mustard, MD;  Location: [facility];  Service: Orthopedics;;   KNEE ARTHROSCOPY WITH MEDIAL MENISECTOMY Right 07/16/2021   Procedure: ARTHROSCOPIC DEBRIDEMENT RIGHT KNEE, PARTIAL MEDIAL AND LATERAL MENISECTOMIES;  Surgeon: Nadara Mustard, MD;  Location: [facility];  Service: Orthopedics;  Laterality: Right;   POLYPECTOMY     thyroid ablation     x2    TONSILLECTOMY     TOTAL KNEE ARTHROPLASTY Left 09/16/2023   Procedure: LEFT TOTAL KNEE ARTHROPLASTY;  Surgeon: Nadara Mustard, MD;  Location: [facility] OR;  Service: Orthopedics;  Laterality: Left;   TUBAL LIGATION  1980   Patient Active Problem List   Diagnosis Date Noted   Primary osteoarthritis of left knee 09/16/2023   Total knee replacement status, left 09/16/2023   Preop cardiovascular exam 08/27/2023   Retrosternal discomfort 05/01/2023   PSVT (paroxysmal supraventricular tachycardia) (HCC) 01/29/2023   History of meniscal tear    History of colon polyps 09/23/2011   GERD (gastroesophageal reflux disease) 09/23/2011   Graves' disease 09/23/2011   HTN (hypertension) 09/23/2011   Migraines 09/23/2011   Anemia 09/23/2011    PCP: [name] NP  REFERRING PROVIDER: Nadara Mustard, MD  REFERRING DIAG: [phone] (ICD-10-CM) - Status post total knee replacement, left  THERAPY DIAG:  Chronic pain of left knee  Stiffness of left knee, not elsewhere classified  Muscle weakness (generalized)  Difficulty in walking, not elsewhere classified  Rationale for Evaluation and Treatment: Rehabilitation  ONSET DATE: Surgery 09/16/2023  SUBJECTIVE:   SUBJECTIVE STATEMENT:  Not using walker out of the house, no more almost falls or unsteadiness, being more  careful.   PERTINENT HISTORY: History of arthritis, DM, GERD, HTN, hyperthyroidism, IBS,   PAIN:  NPRS scale: 1-2/10 Pain location: Lt knee Pain description: shooting, tightness Aggravating factors:  end ranges, static positioning for tightness, nighttime pains, WB Relieving factors: ice  PRECAUTIONS: None  WEIGHT BEARING RESTRICTIONS: No  FALLS:  Has patient fallen in last 6 months? No  LIVING ENVIRONMENT: Lives with: Lives alone Lives in: House/apartment Stairs: no stairs at home Has following equipment at home: FWW  Daughter lives on a 3rd floor.   OCCUPATION: Retired  PLOF: Independent, shopping, housework.     PATIENT GOALS: Reduce pain, get back to walking.   OBJECTIVE:   PATIENT SURVEYS:  10/26/2023 FOTO update:    10/12/2023 FOTO intake: 47   predicted:  55  COGNITION: 10/12/2023 Overall cognitive status: WFL    SENSATION: 10/12/2023 Not tested  EDEMA:  10/12/2023 Localized edema noted Lt knee, lower leg.   MUSCLE LENGTH: 10/12/2023 No specific testing  POSTURE:  10/12/2023 No Significant postural limitations  PALPATION: 10/12/2023 General tenderness to light touch noted.   LOWER EXTREMITY ROM:   ROM Right 10/12/2023 Left 10/12/2023 Left 10/16/2023 Left 10/18/24 Left 10/26/2023  Hip flexion       Hip extension       Hip abduction       Hip adduction       Hip internal rotation       Hip external rotation       Knee flexion  85 AROM in supine heel slide 91 AROM in supine heel slide   102 AROM in supine heel slide  Knee extension  -17 AROM in seated LAQ    0 deg PROM supine -4 AROM in seated LAQ  Ankle dorsiflexion       Ankle plantarflexion       Ankle inversion       Ankle eversion        (Blank rows = not tested)  LOWER EXTREMITY MMT:  MMT Right 10/12/2023 Left 10/12/2023 Left 10/26/2023  Hip flexion 5/5 4/5 5/5  Hip extension     Hip abduction     Hip adduction     Hip internal rotation     Hip external rotation     Knee flexion 5/5 3+/5 5/5  Knee extension 5/5 2+/5 4+/5  Ankle dorsiflexion 5/5 4/5 5/5  Ankle plantarflexion     Ankle inversion     Ankle eversion      (Blank rows = not tested)  LOWER EXTREMITY SPECIAL TESTS:  10/12/2023 No specific testing today  FUNCTIONAL TESTS:  10/26/2023:   TUG: FWW: : 19.5 seconds.  SPC: 30 seconds   10/12/2023 TUG:  FWW: 19.76 seconds  18 inch chair transfer: able without UE on chair but on knees and deviation to Rt leg.  Lt SLS: unable Rt SLS: < 3 seconds  GAIT: 10/26/2023: Able to walk mod independent with SPC c good control.  Able to do household distances in clinic independent.    10/12/2023 With FWW lacking full TKE, reduced stance  TODAY'S TREATMENT                                                                          DATE:    10/30/23  TherEx  Nustep L5 x10 minutes four extremities  Knee flexion 15x5 second holds surgical LE Shuttle LE press 75# BLEs 2x15, surgical LE 37# 2x15  LAQs 0# x15  Bridges with progressive knee flexion 3x5   Manual  Tennis ball massage distal quad especially VMO        10/26/2023 Therex: Nustep lvl 5 10 mins UE/LE Incline gastroc stretch 30 sec x 3 bilateral Seated Lt LAQ with end range pauses each direction x 10  Supine Lt knee LAQ in 90 deg hip flexion 2x 10 Supine Lt knee heel slide AROM with quad set combo 5 sec hold x 10 each    Neuro Re-ed Tandem ambulation fwd/back 10 ft x 5 each way with occasional HHA  TherActivity Step on over and down 4 inch step WB on Lt leg 2 x 10 with single hand on rail.  Step up forward on lt leg 6 inch step x 15  TUG x 1 c SPC, x 1 with FWW Ambulation in clinic < 100 ft x 5 between activity with SPC and supervision.   Vaso 10 mins in elevation medium compression 34 deg Lt knee   TODAY'S TREATMENT                                                                          DATE:10/21/2023 Therex: Nustep lvl 5 10 mins UE/LE Incline gastroc stretch 30 sec x 3 bilateral 1 minute tailgate swinging ROM Lt knee Seated Lt LAQ with end range pauses each direction 2 x15 4 lbs    Neuro Re-ed Tandem ambulation fwd/back 10 ft x 5 each way with occasional HHA Tandem stance on foam 1 min x 1 bilateral with occasional HHA  Retro step x 15 bilateral in // bars   TherActivity Leg press double leg 75 lbs x 15, single leg x 15 Lt SPC use in clinic household distances between activity 100 ft,  50 ft x 3 Step up fwd 4 inch  step Lt leg WB x 15  Manual Seated Lt knee flexion c mobilization movement IR/distraction  Vaso 10 mins in elevation medium compression 34 deg Lt knee    PATIENT EDUCATION:  10/12/2023 Education details: HEP, POC Person educated: Patient Education method: Programmer, multimedia, Facilities manager, Verbal cues, and Handouts Education comprehension: verbalized understanding, returned demonstration, and verbal cues required  HOME EXERCISE PROGRAM: Access Code: [email or web address] URL: [email or web address] Date: 10/12/2023 Prepared by: Chyrel Masson  Exercises - Supine Heel Slide (Mirrored)  - 3-5 x daily - 7 x weekly - 1 sets - 10 reps - 5 hold - Supine Heel Slide with Strap  - 3-5 x daily - 7 x weekly - 1 sets - 10 reps - 5 hold - Seated Long Arc Quad (Mirrored)  - 3-5  x daily - 7 x weekly - 1 sets - 5-10 reps - 2 hold - Supine Knee Extension Mobilization with Weight (Mirrored)  - 4-5 x daily - 7 x weekly - 1 sets - 1 reps - to tolerance up to 15 mins hold - Seated Quad Set (Mirrored)  - 3-5 x daily - 7 x weekly - 1 sets - 10 reps - 5 hold - Seated Knee Flexion AAROM (Mirrored)  - 3-5 x daily - 7 x weekly - 1 sets - 5 reps - 10-15 hold  ASSESSMENT:  CLINICAL IMPRESSION:  Pt arrives today doing well, not using RW today and feeling more steady on  her feet- has been more careful with mobility when not using AD. Continued work on ROM and strength as appropriate, also incorporated tennis ball massage to distal quad today with good tolerance noted.   OBJECTIVE IMPAIRMENTS: Abnormal gait, decreased activity tolerance, decreased balance, decreased coordination, decreased endurance, decreased mobility, difficulty walking, decreased ROM, decreased strength, hypomobility, increased edema, increased fascial restrictions, impaired perceived functional ability, increased muscle spasms, impaired flexibility, improper body mechanics, and pain.   ACTIVITY LIMITATIONS: carrying, lifting, bending, sitting,  standing, squatting, sleeping, stairs, transfers, bed mobility, and locomotion level  PARTICIPATION LIMITATIONS: meal prep, cleaning, laundry, interpersonal relationship, driving, shopping, and community activity  PERSONAL FACTORS:  History of arthritis, DM, GERD, HTN, hyperthyroidism, IBS,   are also affecting patient's functional outcome.   REHAB POTENTIAL: Good  CLINICAL DECISION MAKING: Stable/uncomplicated  EVALUATION COMPLEXITY: Low   GOALS: Goals reviewed with patient? Yes  SHORT TERM GOALS: (target date for Short term goals are 3 weeks 11/02/2023)   1.  Patient will demonstrate independent use of home exercise program to maintain progress from in clinic treatments.  Goal status: Met   LONG TERM GOALS: (target dates for all long term goals are 12 weeks  01/04/2024 )   1. Patient will demonstrate/report pain at worst less than or equal to 2/10 to facilitate minimal limitation in daily activity secondary to pain symptoms.  Goal status: on going 10/26/2023   2. Patient will demonstrate independent use of home exercise program to facilitate ability to maintain/progress functional gains from skilled physical therapy services.  Goal status:  on going 10/26/2023   3. Patient will demonstrate FOTO outcome > or = 55 % to indicate reduced disability due to condition.  Goal status:  on going 10/26/2023   4.  Patient will demonstrate Lt LE MMT 5/5 throughout to faciltiate usual transfers, stairs, squatting at [facility] for daily life.   Goal status:  on going 10/26/2023   5.  Patient will demonstrate Lt knee AROM 0-110 deg to facilitate usual transfers, ambulation and other daily activity at PLOF Goal status:  on going 10/26/2023   6.  Patient will demonstrate independent ambulation community distances > 300 ft for community integration Goal status:  on going 10/26/2023   7. Patient will demonstrate ascending/descending stairs reciprocally s UE assist for community integration.   Goal  Status:  on going 10/26/2023   PLAN:  PT FREQUENCY: 1-2x/week  PT DURATION: 12 weeks  PLANNED INTERVENTIONS: Can include 16109- PT Re-evaluation, 97110-Therapeutic exercises, 97530- Therapeutic activity, 97112- Neuromuscular re-education, 97535- Self Care, 97140- Manual therapy, [phone]- Gait training, 97014- Electrical stimulation (unattended),  97016- Vasopneumatic device,   Patient/Family education, Balance training, Stair training, Taping, Dry Needling, Joint mobilization, Joint manipulation, Spinal manipulation, Spinal mobilization, Scar mobilization, Vestibular training, Visual/preceptual remediation/compensation, DME instructions, Cryotherapy, and Moist heat.  All performed as medically necessary.  All included unless contraindicated  PLAN FOR NEXT SESSION:SPC in clinic.  Progressive strengthening/ balance improvements. Possible DN to distal quad if she is agreeable?    Nedra Hai, PT, DPT 10/30/23 4:00 PM     Referring diagnosis? Q46.962 (ICD-10-CM) - Status post total knee replacement, left Treatment diagnosis? (if different than referring diagnosis) M25.562 What was this (referring dx) caused by? [x]  Surgery []  Fall []  Ongoing issue []  Arthritis []  Other: ____________  Laterality: []  Rt [x]  Lt []  Both  Check all possible CPT codes:  *CHOOSE 10 OR LESS*    See Planned Interventions listed in the Plan section of the Evaluation.

## 2023-11-03 ENCOUNTER — Ambulatory Visit: Payer: Medicare HMO | Admitting: Rehabilitative and Restorative Service Providers"

## 2023-11-03 ENCOUNTER — Encounter: Payer: Self-pay | Admitting: Rehabilitative and Restorative Service Providers"

## 2023-11-03 DIAGNOSIS — M6281 Muscle weakness (generalized): Secondary | ICD-10-CM

## 2023-11-03 DIAGNOSIS — M25662 Stiffness of left knee, not elsewhere classified: Secondary | ICD-10-CM

## 2023-11-03 DIAGNOSIS — R262 Difficulty in walking, not elsewhere classified: Secondary | ICD-10-CM

## 2023-11-03 DIAGNOSIS — M25562 Pain in left knee: Secondary | ICD-10-CM

## 2023-11-03 DIAGNOSIS — G8929 Other chronic pain: Secondary | ICD-10-CM

## 2023-11-03 NOTE — Therapy (Signed)
OUTPATIENT PHYSICAL THERAPY  TREATMENT   Patient Name: Denise Ferguson MRN: [id number] DOB:1951-09-13, 72 y.o., female Today's Date: 11/03/2023  END OF SESSION:  PT End of Session - 11/03/23 1540     Visit Number 7    Number of Visits 24    Date for PT Re-Evaluation 01/04/24    Authorization Type [employer] $25 copay    Authorization Time Period 10/12/2023 -01/03/2023    Authorization - Visit Number 7    Authorization - Number of Visits 12    Progress Note Due on Visit 13    PT Start Time 1540    PT Stop Time 1620    PT Time Calculation (min) 40 min    Activity Tolerance Patient tolerated treatment well    Behavior During Therapy WFL for tasks assessed/performed                  Past Medical History:  Diagnosis Date   Allergy    Anemia    Arthritis    knees   Diabetes mellitus without complication (HCC)    " Pre" DM- on Metformin    FHx: migraine headaches    GERD (gastroesophageal reflux disease)    Grave's disease    History of colon polyps    Hx of migraines    Hypertension    Hyperthyroidism    IBS (irritable bowel syndrome)    PONV (postoperative nausea and vomiting)    Pre-diabetes    PSVT (paroxysmal supraventricular tachycardia) (HCC)    Past Surgical History:  Procedure Laterality Date   ABDOMINAL HYSTERECTOMY     BREAST BIOPSY Left    CARPAL TUNNEL RELEASE  2000   bilateral   CATARACT EXTRACTION Bilateral 03/20/2021   COLONOSCOPY     KNEE ARTHROSCOPY WITH LATERAL MENISECTOMY  07/16/2021   Procedure: KNEE ARTHROSCOPY WITH LATERAL MENISECTOMY;  Surgeon: Nadara Mustard, MD;  Location: [facility];  Service: Orthopedics;;   KNEE ARTHROSCOPY WITH MEDIAL MENISECTOMY Right 07/16/2021   Procedure: ARTHROSCOPIC DEBRIDEMENT RIGHT KNEE, PARTIAL MEDIAL AND LATERAL MENISECTOMIES;  Surgeon: Nadara Mustard, MD;  Location: [facility];  Service: Orthopedics;  Laterality: Right;   POLYPECTOMY     thyroid ablation     x2    TONSILLECTOMY     TOTAL KNEE ARTHROPLASTY Left 09/16/2023   Procedure: LEFT TOTAL KNEE ARTHROPLASTY;  Surgeon: Nadara Mustard, MD;  Location: [facility] OR;  Service: Orthopedics;  Laterality: Left;   TUBAL LIGATION  1980   Patient Active Problem List   Diagnosis Date Noted   Primary osteoarthritis of left knee 09/16/2023   Total knee replacement status, left 09/16/2023   Preop cardiovascular exam 08/27/2023   Retrosternal discomfort 05/01/2023   PSVT (paroxysmal supraventricular tachycardia) (HCC) 01/29/2023   History of meniscal tear    History of colon polyps 09/23/2011   GERD (gastroesophageal reflux disease) 09/23/2011   Graves' disease 09/23/2011   HTN (hypertension) 09/23/2011   Migraines 09/23/2011   Anemia 09/23/2011    PCP: [name] NP  REFERRING PROVIDER: Nadara Mustard, MD  REFERRING DIAG: [phone] (ICD-10-CM) - Status post total knee replacement, left  THERAPY DIAG:  Chronic pain of left knee  Stiffness of left knee, not elsewhere classified  Muscle weakness (generalized)  Difficulty in walking, not elsewhere classified  Rationale for Evaluation and Treatment: Rehabilitation  ONSET DATE: Surgery 09/16/2023  SUBJECTIVE:   SUBJECTIVE STATEMENT: Pt indicated 2/10 upon arrival and been using hands/ball to work tightness out.  Reported  not using cane at home or to clinic.   PERTINENT HISTORY: History of arthritis, DM, GERD, HTN, hyperthyroidism, IBS,   PAIN:  NPRS scale: 2/10  Pain location: Lt knee Pain description: shooting, tightness Aggravating factors:  end ranges, static positioning for tightness, nighttime pains, WB Relieving factors: ice  PRECAUTIONS: None  WEIGHT BEARING RESTRICTIONS: No  FALLS:  Has patient fallen in last 6 months? No  LIVING ENVIRONMENT: Lives with: Lives alone Lives in: House/apartment Stairs: no stairs at home Has following equipment at home: FWW  Daughter lives on a 3rd floor.   OCCUPATION: Retired  PLOF:  Independent, shopping, housework.    PATIENT GOALS: Reduce pain, get back to walking.   OBJECTIVE:   PATIENT SURVEYS:  11/03/2023: update:  52  10/12/2023 FOTO intake: 47   predicted:  55  COGNITION: 10/12/2023 Overall cognitive status: WFL    SENSATION: 10/12/2023 Not tested  EDEMA:  10/12/2023 Localized edema noted Lt knee, lower leg.   MUSCLE LENGTH: 10/12/2023 No specific testing  POSTURE:  10/12/2023 No Significant postural limitations  PALPATION: 10/12/2023 General tenderness to light touch noted.   LOWER EXTREMITY ROM:   ROM Right 10/12/2023 Left 10/12/2023 Left 10/16/2023 Left 10/18/24 Left 10/26/2023  Hip flexion       Hip extension       Hip abduction       Hip adduction       Hip internal rotation       Hip external rotation       Knee flexion  85 AROM in supine heel slide 91 AROM in supine heel slide   102 AROM in supine heel slide  Knee extension  -17 AROM in seated LAQ    0 deg PROM supine -4 AROM in seated LAQ  Ankle dorsiflexion       Ankle plantarflexion       Ankle inversion       Ankle eversion        (Blank rows = not tested)  LOWER EXTREMITY MMT:  MMT Right 10/12/2023 Left 10/12/2023 Left 10/26/2023  Hip flexion 5/5 4/5 5/5  Hip extension     Hip abduction     Hip adduction     Hip internal rotation     Hip external rotation     Knee flexion 5/5 3+/5 5/5  Knee extension 5/5 2+/5 4+/5  Ankle dorsiflexion 5/5 4/5 5/5  Ankle plantarflexion     Ankle inversion     Ankle eversion      (Blank rows = not tested)  LOWER EXTREMITY SPECIAL TESTS:  10/12/2023 No specific testing today  FUNCTIONAL TESTS:  10/26/2023:   TUG: FWW: : 19.5 seconds.  SPC: 30 seconds   10/12/2023 TUG:  FWW: 19.76 seconds  18 inch chair transfer: able without UE on chair but on knees and deviation to Rt leg.  Lt SLS: unable Rt SLS: < 3 seconds  GAIT: 11/03/2023: independent ambulation   10/26/2023: Able to walk mod independent with SPC c  good control.  Able to do household distances in clinic independent.   10/12/2023 With FWW lacking full TKE, reduced stance  TODAY'S TREATMENT                                                                          DATE:11/03/2023 Therex: UBE UE/LE for ROM 10 mins lvl 1.0, seat 9  Incline gastroc stretch 30 sec x 3 bilateral Leg press double leg 75 lbs x 15, single leg Lt 31 lbs 2 x 15  Seated Lt leg LAQ x 15   Neuro Re-ed Tandem stance on foam 1 min x 2 bilateral  SLS with vector reach light touch fwd, side, and back x 8 each WB on Lt leg   Manual Percussive device to Lt quad/lateral thigh for soft tissue mobilization.  Cross friction massage perforance and instruction.   Vaso Deferred today in trial run without.    TODAY'S TREATMENT                                                                          DATE: 10/30/23 TherEx Nustep L5 x10 minutes four extremities  Knee flexion 15x5 second holds surgical LE Shuttle LE press 75# BLEs 2x15, surgical LE 37# 2x15  LAQs 0# x15  Bridges with progressive knee flexion 3x5   Manual Tennis ball massage distal quad especially VMO  TODAY'S TREATMENT                                                                          DATE:10/26/2023 Therex: Nustep lvl 5 10 mins UE/LE Incline gastroc stretch 30 sec x 3 bilateral Seated Lt LAQ with end range pauses each direction x 10  Supine Lt knee LAQ in 90 deg hip flexion 2x 10 Supine Lt knee heel slide AROM with quad set combo 5 sec hold x 10 each   Neuro Re-ed Tandem ambulation fwd/back 10 ft x 5 each way with occasional HHA  TherActivity Step on over and down 4 inch step WB on Lt leg 2 x 10 with single hand on rail.  Step up forward on lt leg 6 inch step x 15  TUG x 1 c SPC, x 1 with FWW Ambulation in clinic < 100 ft x  5 between activity with SPC and supervision.   Vaso 10 mins in elevation medium compression 34 deg Lt knee    PATIENT EDUCATION:  10/12/2023 Education details: HEP, POC Person educated: Patient Education method: Programmer, multimedia, Demonstration, Verbal cues, and Handouts Education comprehension: verbalized understanding, returned demonstration, and verbal cues required  HOME EXERCISE PROGRAM: Access Code: [email or web address] URL: [email or web address] Date: 10/12/2023 Prepared by: Chyrel Masson  Exercises - Supine Heel Slide (Mirrored)  - 3-5 x daily - 7 x weekly - 1 sets - 10 reps - 5 hold - Supine Heel Slide  with Strap  - 3-5 x daily - 7 x weekly - 1 sets - 10 reps - 5 hold - Seated Long Arc Quad (Mirrored)  - 3-5 x daily - 7 x weekly - 1 sets - 5-10 reps - 2 hold - Supine Knee Extension Mobilization with Weight (Mirrored)  - 4-5 x daily - 7 x weekly - 1 sets - 1 reps - to tolerance up to 15 mins hold - Seated Quad Set (Mirrored)  - 3-5 x daily - 7 x weekly - 1 sets - 10 reps - 5 hold - Seated Knee Flexion AAROM (Mirrored)  - 3-5 x daily - 7 x weekly - 1 sets - 5 reps - 10-15 hold  ASSESSMENT:  CLINICAL IMPRESSION: FOTO was improved compared to previous assessment.  Continued plan for strengthening, balance improvements and functional mobility gains.  Continued skilled PT services indicated at this time.   OBJECTIVE IMPAIRMENTS: Abnormal gait, decreased activity tolerance, decreased balance, decreased coordination, decreased endurance, decreased mobility, difficulty walking, decreased ROM, decreased strength, hypomobility, increased edema, increased fascial restrictions, impaired perceived functional ability, increased muscle spasms, impaired flexibility, improper body mechanics, and pain.   ACTIVITY LIMITATIONS: carrying, lifting, bending, sitting, standing, squatting, sleeping, stairs, transfers, bed mobility, and locomotion level  PARTICIPATION LIMITATIONS: meal prep, cleaning,  laundry, interpersonal relationship, driving, shopping, and community activity  PERSONAL FACTORS:  History of arthritis, DM, GERD, HTN, hyperthyroidism, IBS,   are also affecting patient's functional outcome.   REHAB POTENTIAL: Good  CLINICAL DECISION MAKING: Stable/uncomplicated  EVALUATION COMPLEXITY: Low   GOALS: Goals reviewed with patient? Yes  SHORT TERM GOALS: (target date for Short term goals are 3 weeks 11/02/2023)   1.  Patient will demonstrate independent use of home exercise program to maintain progress from in clinic treatments.  Goal status: Met   LONG TERM GOALS: (target dates for all long term goals are 12 weeks  01/04/2024 )   1. Patient will demonstrate/report pain at worst less than or equal to 2/10 to facilitate minimal limitation in daily activity secondary to pain symptoms.  Goal status: on going 10/26/2023   2. Patient will demonstrate independent use of home exercise program to facilitate ability to maintain/progress functional gains from skilled physical therapy services.  Goal status:  on going 10/26/2023   3. Patient will demonstrate FOTO outcome > or = 55 % to indicate reduced disability due to condition.  Goal status:  on going 10/26/2023   4.  Patient will demonstrate Lt LE MMT 5/5 throughout to faciltiate usual transfers, stairs, squatting at [facility] for daily life.   Goal status:  on going 10/26/2023   5.  Patient will demonstrate Lt knee AROM 0-110 deg to facilitate usual transfers, ambulation and other daily activity at PLOF Goal status:  on going 10/26/2023   6.  Patient will demonstrate independent ambulation community distances > 300 ft for community integration Goal status:  on going 10/26/2023   7. Patient will demonstrate ascending/descending stairs reciprocally s UE assist for community integration.   Goal Status:  on going 10/26/2023   PLAN:  PT FREQUENCY: 1-2x/week  PT DURATION: 12 weeks  PLANNED INTERVENTIONS: Can include 46962-  PT Re-evaluation, 97110-Therapeutic exercises, 97530- Therapeutic activity, 97112- Neuromuscular re-education, 97535- Self Care, 97140- Manual therapy, [id number]- Gait training, 97014- Electrical stimulation (unattended),  97016- Vasopneumatic device,   Patient/Family education, Balance training, Stair training, Taping, Dry Needling, Joint mobilization, Joint manipulation, Spinal manipulation, Spinal mobilization, Scar mobilization, Vestibular training, Visual/preceptual remediation/compensation, DME instructions, Cryotherapy, and Moist  heat.  All performed as medically necessary.  All included unless contraindicated  PLAN FOR NEXT SESSION:  Continued strengthening and balance improvements.    Chyrel Masson, PT, DPT, OCS, ATC 11/03/23  4:21 PM       Referring diagnosis? U44.034 (ICD-10-CM) - Status post total knee replacement, left Treatment diagnosis? (if different than referring diagnosis) M25.562 What was this (referring dx) caused by? [x]  Surgery []  Fall []  Ongoing issue []  Arthritis []  Other: ____________  Laterality: []  Rt [x]  Lt []  Both  Check all possible CPT codes:  *CHOOSE 10 OR LESS*    See Planned Interventions listed in the Plan section of the Evaluation.

## 2023-11-05 ENCOUNTER — Encounter: Payer: Self-pay | Admitting: Rehabilitative and Restorative Service Providers"

## 2023-11-05 ENCOUNTER — Ambulatory Visit: Payer: Medicare HMO | Admitting: Rehabilitative and Restorative Service Providers"

## 2023-11-05 DIAGNOSIS — M6281 Muscle weakness (generalized): Secondary | ICD-10-CM | POA: Diagnosis not present

## 2023-11-05 DIAGNOSIS — R262 Difficulty in walking, not elsewhere classified: Secondary | ICD-10-CM | POA: Diagnosis not present

## 2023-11-05 DIAGNOSIS — M25562 Pain in left knee: Secondary | ICD-10-CM

## 2023-11-05 DIAGNOSIS — M25662 Stiffness of left knee, not elsewhere classified: Secondary | ICD-10-CM | POA: Diagnosis not present

## 2023-11-05 DIAGNOSIS — G8929 Other chronic pain: Secondary | ICD-10-CM

## 2023-11-05 NOTE — Therapy (Signed)
OUTPATIENT PHYSICAL THERAPY  TREATMENT   Patient Name: Denise Ferguson MRN: [id number] DOB:[birth year], 72 y.o., female Today's Date: 11/05/2023  END OF SESSION:  PT End of Session - 11/05/23 1551     Visit Number 8    Number of Visits 24    Date for PT Re-Evaluation 01/04/24    Authorization Type [employer] $25 copay    Authorization Time Period 10/12/2023 -01/03/2023    Authorization - Visit Number 8    Authorization - Number of Visits 12    Progress Note Due on Visit 13    PT Start Time 1544    PT Stop Time 1624    PT Time Calculation (min) 40 min    Activity Tolerance Patient tolerated treatment well    Behavior During Therapy WFL for tasks assessed/performed                   Past Medical History:  Diagnosis Date   Allergy    Anemia    Arthritis    knees   Diabetes mellitus without complication (HCC)    " Pre" DM- on Metformin    FHx: migraine headaches    GERD (gastroesophageal reflux disease)    Grave's disease    History of colon polyps    Hx of migraines    Hypertension    Hyperthyroidism    IBS (irritable bowel syndrome)    PONV (postoperative nausea and vomiting)    Pre-diabetes    PSVT (paroxysmal supraventricular tachycardia) (HCC)    Past Surgical History:  Procedure Laterality Date   ABDOMINAL HYSTERECTOMY     BREAST BIOPSY Left    CARPAL TUNNEL RELEASE  2000   bilateral   CATARACT EXTRACTION Bilateral 03/20/2021   COLONOSCOPY     KNEE ARTHROSCOPY WITH LATERAL MENISECTOMY  07/16/2021   Procedure: KNEE ARTHROSCOPY WITH LATERAL MENISECTOMY;  Surgeon: Nadara Mustard, MD;  Location: [facility];  Service: Orthopedics;;   KNEE ARTHROSCOPY WITH MEDIAL MENISECTOMY Right 07/16/2021   Procedure: ARTHROSCOPIC DEBRIDEMENT RIGHT KNEE, PARTIAL MEDIAL AND LATERAL MENISECTOMIES;  Surgeon: Nadara Mustard, MD;  Location: [facility];  Service: Orthopedics;  Laterality: Right;   POLYPECTOMY     thyroid ablation     x2    TONSILLECTOMY     TOTAL KNEE ARTHROPLASTY Left 09/16/2023   Procedure: LEFT TOTAL KNEE ARTHROPLASTY;  Surgeon: Nadara Mustard, MD;  Location: [facility] OR;  Service: Orthopedics;  Laterality: Left;   TUBAL LIGATION  1980   Patient Active Problem List   Diagnosis Date Noted   Primary osteoarthritis of left knee 09/16/2023   Total knee replacement status, left 09/16/2023   Preop cardiovascular exam 08/27/2023   Retrosternal discomfort 05/01/2023   PSVT (paroxysmal supraventricular tachycardia) (HCC) 01/29/2023   History of meniscal tear    History of colon polyps 09/23/2011   GERD (gastroesophageal reflux disease) 09/23/2011   Graves' disease 09/23/2011   HTN (hypertension) 09/23/2011   Migraines 09/23/2011   Anemia 09/23/2011    PCP: [name] NP  REFERRING PROVIDER: Nadara Mustard, MD  REFERRING DIAG: [phone] (ICD-10-CM) - Status post total knee replacement, left  THERAPY DIAG:  Chronic pain of left knee  Stiffness of left knee, not elsewhere classified  Muscle weakness (generalized)  Difficulty in walking, not elsewhere classified  Rationale for Evaluation and Treatment: Rehabilitation  ONSET DATE: Surgery 09/16/2023  SUBJECTIVE:   SUBJECTIVE STATEMENT: Pt indicated tightness in bending for car transfers still tight/painful.   PERTINENT HISTORY:  History of arthritis, DM, GERD, HTN, hyperthyroidism, IBS,   PAIN:  NPRS scale: 2/10  Pain location: Lt knee Pain description: shooting, tightness Aggravating factors:  end ranges, static positioning for tightness, nighttime pains, WB Relieving factors: ice  PRECAUTIONS: None  WEIGHT BEARING RESTRICTIONS: No  FALLS:  Has patient fallen in last 6 months? No  LIVING ENVIRONMENT: Lives with: Lives alone Lives in: House/apartment Stairs: no stairs at home Has following equipment at home: FWW  Daughter lives on a 3rd floor.   OCCUPATION: Retired  PLOF: Independent, shopping, housework.    PATIENT GOALS:  Reduce pain, get back to walking.   OBJECTIVE:   PATIENT SURVEYS:  11/03/2023: update:  52  10/12/2023 FOTO intake: 47   predicted:  55  COGNITION: 10/12/2023 Overall cognitive status: WFL    SENSATION: 10/12/2023 Not tested  EDEMA:  10/12/2023 Localized edema noted Lt knee, lower leg.   MUSCLE LENGTH: 10/12/2023 No specific testing  POSTURE:  10/12/2023 No Significant postural limitations  PALPATION: 10/12/2023 General tenderness to light touch noted.   LOWER EXTREMITY ROM:   ROM Right 10/12/2023 Left 10/12/2023 Left 10/16/2023 Left 10/18/24 Left 10/26/2023  Hip flexion       Hip extension       Hip abduction       Hip adduction       Hip internal rotation       Hip external rotation       Knee flexion  85 AROM in supine heel slide 91 AROM in supine heel slide   102 AROM in supine heel slide  Knee extension  -17 AROM in seated LAQ    0 deg PROM supine -4 AROM in seated LAQ  Ankle dorsiflexion       Ankle plantarflexion       Ankle inversion       Ankle eversion        (Blank rows = not tested)  LOWER EXTREMITY MMT:  MMT Right 10/12/2023 Left 10/12/2023 Left 10/26/2023  Hip flexion 5/5 4/5 5/5  Hip extension     Hip abduction     Hip adduction     Hip internal rotation     Hip external rotation     Knee flexion 5/5 3+/5 5/5  Knee extension 5/5 2+/5 4+/5  Ankle dorsiflexion 5/5 4/5 5/5  Ankle plantarflexion     Ankle inversion     Ankle eversion      (Blank rows = not tested)  LOWER EXTREMITY SPECIAL TESTS:  10/12/2023 No specific testing today  FUNCTIONAL TESTS:  10/26/2023:   TUG: FWW: : 19.5 seconds.  SPC: 30 seconds   10/12/2023 TUG:  FWW: 19.76 seconds  18 inch chair transfer: able without UE on chair but on knees and deviation to Rt leg.  Lt SLS: unable Rt SLS: < 3 seconds  GAIT: 11/03/2023: independent ambulation   10/26/2023: Able to walk mod independent with SPC c good control.  Able to do household distances in  clinic independent.   10/12/2023 With FWW lacking full TKE, reduced stance  TODAY'S TREATMENT                                                                          DATE:  11/05/2023 Therex: Recumbent bike attempt for 2 mins but limited due to end range stretch too much despite cues for partial circles.  Nustep Lvl 6 8 mins UE/LE for ROM Incline gastroc stretch 30 sec x 3 bilateral Seated tailgate flexion swinging Lt knee 1 min   TherActivity Leg press double leg 75 lbs x 15, single leg Lt 31 lbs 2 x 15  Step on over and down WB on lt leg 4 inch step with single rail assist x 15 Flight of stairs in clinic up/down with single rail assist and reciprocal gait pattern   Manual Percussive device to Lt quad/lateral thigh for soft tissue mobilization with knee flexion mobility.     TODAY'S TREATMENT                                                                          DATE:11/03/2023 Therex: UBE UE/LE for ROM 10 mins lvl 1.0, seat 9  Incline gastroc stretch 30 sec x 3 bilateral Leg press double leg 75 lbs x 15, single leg Lt 31 lbs 2 x 15  Seated Lt leg LAQ x 15   Neuro Re-ed Tandem stance on foam 1 min x 2 bilateral  SLS with vector reach light touch fwd, side, and back x 8 each WB on Lt leg   Manual Percussive device to Lt quad/lateral thigh for soft tissue mobilization.  Cross friction massage perforance and instruction.   Vaso Deferred today in trial run without.    TODAY'S TREATMENT                                                                          DATE: 10/30/23 TherEx Nustep L5 x10 minutes four extremities  Knee flexion 15x5 second holds surgical LE Shuttle LE press 75# BLEs 2x15, surgical LE 37# 2x15  LAQs 0# x15  Bridges with progressive knee flexion 3x5   Manual Tennis ball massage  distal quad especially VMO    PATIENT EDUCATION:  10/12/2023 Education details: HEP, POC Person educated: Patient Education method: Programmer, multimedia, Facilities manager, Verbal cues, and Handouts Education comprehension: verbalized understanding, returned demonstration, and verbal cues required  HOME EXERCISE PROGRAM: Access Code: [email or web address] URL: [email or web address] Date: 10/12/2023 Prepared by: Chyrel Masson  Exercises - Supine Heel Slide (Mirrored)  - 3-5 x daily - 7 x weekly - 1 sets - 10 reps - 5 hold - Supine Heel Slide with Strap  - 3-5 x daily - 7 x weekly - 1 sets - 10 reps - 5 hold -  Seated Long Arc Quad (Mirrored)  - 3-5 x daily - 7 x weekly - 1 sets - 5-10 reps - 2 hold - Supine Knee Extension Mobilization with Weight (Mirrored)  - 4-5 x daily - 7 x weekly - 1 sets - 1 reps - to tolerance up to 15 mins hold - Seated Quad Set (Mirrored)  - 3-5 x daily - 7 x weekly - 1 sets - 10 reps - 5 hold - Seated Knee Flexion AAROM (Mirrored)  - 3-5 x daily - 7 x weekly - 1 sets - 5 reps - 10-15 hold  ASSESSMENT:  CLINICAL IMPRESSION: Improving functional WB acceptance with some progression on ability on stair navigation.  Attempted normal flight c reciprocal pattern in clinic today with single rail assist.  Able to perform with some mild increase in difficulty in Lt leg WB.  Continued skilled PT services indicated at this time.   OBJECTIVE IMPAIRMENTS: Abnormal gait, decreased activity tolerance, decreased balance, decreased coordination, decreased endurance, decreased mobility, difficulty walking, decreased ROM, decreased strength, hypomobility, increased edema, increased fascial restrictions, impaired perceived functional ability, increased muscle spasms, impaired flexibility, improper body mechanics, and pain.   ACTIVITY LIMITATIONS: carrying, lifting, bending, sitting, standing, squatting, sleeping, stairs, transfers, bed mobility, and locomotion level  PARTICIPATION  LIMITATIONS: meal prep, cleaning, laundry, interpersonal relationship, driving, shopping, and community activity  PERSONAL FACTORS:  History of arthritis, DM, GERD, HTN, hyperthyroidism, IBS,   are also affecting patient's functional outcome.   REHAB POTENTIAL: Good  CLINICAL DECISION MAKING: Stable/uncomplicated  EVALUATION COMPLEXITY: Low   GOALS: Goals reviewed with patient? Yes  SHORT TERM GOALS: (target date for Short term goals are 3 weeks 11/02/2023)   1.  Patient will demonstrate independent use of home exercise program to maintain progress from in clinic treatments.  Goal status: Met   LONG TERM GOALS: (target dates for all long term goals are 12 weeks  01/04/2024 )   1. Patient will demonstrate/report pain at worst less than or equal to 2/10 to facilitate minimal limitation in daily activity secondary to pain symptoms.  Goal status: on going 10/26/2023   2. Patient will demonstrate independent use of home exercise program to facilitate ability to maintain/progress functional gains from skilled physical therapy services.  Goal status:  on going 10/26/2023   3. Patient will demonstrate FOTO outcome > or = 55 % to indicate reduced disability due to condition.  Goal status:  on going 10/26/2023   4.  Patient will demonstrate Lt LE MMT 5/5 throughout to faciltiate usual transfers, stairs, squatting at [facility] for daily life.   Goal status:  on going 10/26/2023   5.  Patient will demonstrate Lt knee AROM 0-110 deg to facilitate usual transfers, ambulation and other daily activity at PLOF Goal status:  on going 10/26/2023   6.  Patient will demonstrate independent ambulation community distances > 300 ft for community integration Goal status:  on going 10/26/2023   7. Patient will demonstrate ascending/descending stairs reciprocally s UE assist for community integration.   Goal Status:  on going 10/26/2023   PLAN:  PT FREQUENCY: 1-2x/week  PT DURATION: 12 weeks  PLANNED  INTERVENTIONS: Can include 87564- PT Re-evaluation, 97110-Therapeutic exercises, 97530- Therapeutic activity, 97112- Neuromuscular re-education, 97535- Self Care, 97140- Manual therapy, [phone]- Gait training, 97014- Electrical stimulation (unattended),  97016- Vasopneumatic device,   Patient/Family education, Balance training, Stair training, Taping, Dry Needling, Joint mobilization, Joint manipulation, Spinal manipulation, Spinal mobilization, Scar mobilization, Vestibular training, Visual/preceptual remediation/compensation, DME instructions, Cryotherapy, and  Moist heat.  All performed as medically necessary.  All included unless contraindicated  PLAN FOR NEXT SESSION:  WB strengthening, compliant balance challenges.    Chyrel Masson, PT, DPT, OCS, ATC 11/05/23  4:25 PM       Referring diagnosis? Z61.096 (ICD-10-CM) - Status post total knee replacement, left Treatment diagnosis? (if different than referring diagnosis) M25.562 What was this (referring dx) caused by? [x]  Surgery []  Fall []  Ongoing issue []  Arthritis []  Other: ____________  Laterality: []  Rt [x]  Lt []  Both  Check all possible CPT codes:  *CHOOSE 10 OR LESS*    See Planned Interventions listed in the Plan section of the Evaluation.

## 2023-11-10 ENCOUNTER — Ambulatory Visit: Payer: Medicare HMO | Admitting: Physical Therapy

## 2023-11-10 ENCOUNTER — Encounter: Payer: Self-pay | Admitting: Physical Therapy

## 2023-11-10 DIAGNOSIS — M25562 Pain in left knee: Secondary | ICD-10-CM | POA: Diagnosis not present

## 2023-11-10 DIAGNOSIS — M25662 Stiffness of left knee, not elsewhere classified: Secondary | ICD-10-CM | POA: Diagnosis not present

## 2023-11-10 DIAGNOSIS — M6281 Muscle weakness (generalized): Secondary | ICD-10-CM | POA: Diagnosis not present

## 2023-11-10 DIAGNOSIS — G8929 Other chronic pain: Secondary | ICD-10-CM

## 2023-11-10 DIAGNOSIS — R262 Difficulty in walking, not elsewhere classified: Secondary | ICD-10-CM

## 2023-11-10 NOTE — Therapy (Signed)
OUTPATIENT PHYSICAL THERAPY  TREATMENT   Patient Name: Denise Ferguson MRN: [id number] DOB:October 04, 1951, 72 y.o., female Today's Date: 11/10/2023  END OF SESSION:  PT End of Session - 11/10/23 1544     Visit Number 9    Number of Visits 24    Date for PT Re-Evaluation 01/04/24    Authorization Type [employer] $25 copay    Authorization Time Period 10/12/2023 -01/03/2023    Authorization - Visit Number 9    Authorization - Number of Visits 12    Progress Note Due on Visit 13    PT Start Time 1545    PT Stop Time 1633    PT Time Calculation (min) 48 min    Activity Tolerance Patient tolerated treatment well    Behavior During Therapy WFL for tasks assessed/performed                   Past Medical History:  Diagnosis Date   Allergy    Anemia    Arthritis    knees   Diabetes mellitus without complication (HCC)    " Pre" DM- on Metformin    FHx: migraine headaches    GERD (gastroesophageal reflux disease)    Grave's disease    History of colon polyps    Hx of migraines    Hypertension    Hyperthyroidism    IBS (irritable bowel syndrome)    PONV (postoperative nausea and vomiting)    Pre-diabetes    PSVT (paroxysmal supraventricular tachycardia) (HCC)    Past Surgical History:  Procedure Laterality Date   ABDOMINAL HYSTERECTOMY     BREAST BIOPSY Left    CARPAL TUNNEL RELEASE  2000   bilateral   CATARACT EXTRACTION Bilateral 03/20/2021   COLONOSCOPY     KNEE ARTHROSCOPY WITH LATERAL MENISECTOMY  07/16/2021   Procedure: KNEE ARTHROSCOPY WITH LATERAL MENISECTOMY;  Surgeon: Nadara Mustard, MD;  Location: [facility];  Service: Orthopedics;;   KNEE ARTHROSCOPY WITH MEDIAL MENISECTOMY Right 07/16/2021   Procedure: ARTHROSCOPIC DEBRIDEMENT RIGHT KNEE, PARTIAL MEDIAL AND LATERAL MENISECTOMIES;  Surgeon: Nadara Mustard, MD;  Location: [facility];  Service: Orthopedics;  Laterality: Right;   POLYPECTOMY     thyroid ablation     x2    TONSILLECTOMY     TOTAL KNEE ARTHROPLASTY Left 09/16/2023   Procedure: LEFT TOTAL KNEE ARTHROPLASTY;  Surgeon: Nadara Mustard, MD;  Location: [facility] OR;  Service: Orthopedics;  Laterality: Left;   TUBAL LIGATION  1980   Patient Active Problem List   Diagnosis Date Noted   Primary osteoarthritis of left knee 09/16/2023   Total knee replacement status, left 09/16/2023   Preop cardiovascular exam 08/27/2023   Retrosternal discomfort 05/01/2023   PSVT (paroxysmal supraventricular tachycardia) (HCC) 01/29/2023   History of meniscal tear    History of colon polyps 09/23/2011   GERD (gastroesophageal reflux disease) 09/23/2011   Graves' disease 09/23/2011   HTN (hypertension) 09/23/2011   Migraines 09/23/2011   Anemia 09/23/2011    PCP: [name] NP  REFERRING PROVIDER: Nadara Mustard, MD  REFERRING DIAG: [phone] (ICD-10-CM) - Status post total knee replacement, left  THERAPY DIAG:  Chronic pain of left knee  Stiffness of left knee, not elsewhere classified  Muscle weakness (generalized)  Difficulty in walking, not elsewhere classified  Rationale for Evaluation and Treatment: Rehabilitation  ONSET DATE: Surgery 09/16/2023  SUBJECTIVE:   SUBJECTIVE STATEMENT: Knee was very sore after the percussion treatment last time. Now is feels okay.  She does request the vaso today  PERTINENT HISTORY: History of arthritis, DM, GERD, HTN, hyperthyroidism, IBS,   PAIN:  NPRS scale: 2/10  Pain location: Lt knee Pain description: shooting, tightness Aggravating factors:  end ranges, static positioning for tightness, nighttime pains, WB Relieving factors: ice  PRECAUTIONS: None  WEIGHT BEARING RESTRICTIONS: No  FALLS:  Has patient fallen in last 6 months? No  LIVING ENVIRONMENT: Lives with: Lives alone Lives in: House/apartment Stairs: no stairs at home Has following equipment at home: FWW  Daughter lives on a 3rd floor.   OCCUPATION: Retired  PLOF: Independent,  shopping, housework.    PATIENT GOALS: Reduce pain, get back to walking.   OBJECTIVE:   PATIENT SURVEYS:  11/03/2023: update:  52  10/12/2023 FOTO intake: 47   predicted:  55  COGNITION: 10/12/2023 Overall cognitive status: WFL    SENSATION: 10/12/2023 Not tested  EDEMA:  10/12/2023 Localized edema noted Lt knee, lower leg.   MUSCLE LENGTH: 10/12/2023 No specific testing  POSTURE:  10/12/2023 No Significant postural limitations  PALPATION: 10/12/2023 General tenderness to light touch noted.   LOWER EXTREMITY ROM:   ROM Right 10/12/2023 Left 10/12/2023 Left 10/16/2023 Left 10/18/24 Left 10/26/2023  Hip flexion       Hip extension       Hip abduction       Hip adduction       Hip internal rotation       Hip external rotation       Knee flexion  85 AROM in supine heel slide 91 AROM in supine heel slide   102 AROM in supine heel slide  Knee extension  -17 AROM in seated LAQ    0 deg PROM supine -4 AROM in seated LAQ  Ankle dorsiflexion       Ankle plantarflexion       Ankle inversion       Ankle eversion        (Blank rows = not tested)  LOWER EXTREMITY MMT:  MMT Right 10/12/2023 Left 10/12/2023 Left 10/26/2023  Hip flexion 5/5 4/5 5/5  Hip extension     Hip abduction     Hip adduction     Hip internal rotation     Hip external rotation     Knee flexion 5/5 3+/5 5/5  Knee extension 5/5 2+/5 4+/5  Ankle dorsiflexion 5/5 4/5 5/5  Ankle plantarflexion     Ankle inversion     Ankle eversion      (Blank rows = not tested)  LOWER EXTREMITY SPECIAL TESTS:  10/12/2023 No specific testing today  FUNCTIONAL TESTS:  10/26/2023:   TUG: FWW: : 19.5 seconds.  SPC: 30 seconds   10/12/2023 TUG:  FWW: 19.76 seconds  18 inch chair transfer: able without UE on chair but on knees and deviation to Rt leg.  Lt SLS: unable Rt SLS: < 3 seconds  GAIT: 11/03/2023: independent ambulation   10/26/2023: Able to walk mod independent with SPC c good control.   Able to do household distances in clinic independent.   10/12/2023 With FWW lacking full TKE, reduced stance  TODAY'S TREATMENT                                                                          DATE:   11/10/2023 Therex: Nustep Lvl 6 8 mins seat #8 UE/LE for ROM Incline gastroc stretch 30 sec x 3 bilateral Seated knee extension machine 5# DL 1O10 stretching into max tolerated knee flexion Seated knee flexion machine 10# Lt LE only 2X10 Seated knee flexion AAROM 5 sec X 10  TherActivity Leg press double leg 75 lbs x 15, single leg Lt 31 lbs 2 x 15  Flight of stairs in clinic up/down with single rail assist and reciprocal gait pattern  Manual therapy Left knee PROM into flexion with overpressure to tolerance  Modalaties -Vasopnuematic device X 10 min, medium compression, 34 deg to Lt knee   11/05/2023 Therex: Recumbent bike attempt for 2 mins but limited due to end range stretch too much despite cues for partial circles.  Nustep Lvl 6 8 mins UE/LE for ROM Incline gastroc stretch 30 sec x 3 bilateral Seated tailgate flexion swinging Lt knee 1 min   TherActivity Leg press double leg 75 lbs x 15, single leg Lt 31 lbs 2 x 15  Step on over and down WB on lt leg 4 inch step with single rail assist x 15 Flight of stairs in clinic up/down with single rail assist and reciprocal gait pattern   Manual Percussive device to Lt quad/lateral thigh for soft tissue mobilization with knee flexion mobility.     TODAY'S TREATMENT                                                                          DATE:11/03/2023 Therex: UBE UE/LE for ROM 10 mins lvl 1.0, seat 9  Incline gastroc stretch 30 sec x 3 bilateral Leg press double leg 75 lbs x 15, single leg Lt 31 lbs 2 x 15  Seated Lt leg LAQ x 15   Neuro  Re-ed Tandem stance on foam 1 min x 2 bilateral  SLS with vector reach light touch fwd, side, and back x 8 each WB on Lt leg   Manual Percussive device to Lt quad/lateral thigh for soft tissue mobilization.  Cross friction massage perforance and instruction.   Vaso Deferred today in trial run without.    TODAY'S TREATMENT                                                                          DATE: 10/30/23 TherEx Nustep L5 x10 minutes four extremities  Knee flexion 15x5 second holds surgical LE Shuttle LE press 75# BLEs 2x15, surgical LE 37# 2x15  LAQs 0# x15  Henreitta Leber  with progressive knee flexion 3x5   Manual Tennis ball massage distal quad especially VMO    PATIENT EDUCATION:  10/12/2023 Education details: HEP, POC Person educated: Patient Education method: Programmer, multimedia, Demonstration, Verbal cues, and Handouts Education comprehension: verbalized understanding, returned demonstration, and verbal cues required  HOME EXERCISE PROGRAM: Access Code: [email or web address] URL: [email or web address] Date: 10/12/2023 Prepared by: Chyrel Masson  Exercises - Supine Heel Slide (Mirrored)  - 3-5 x daily - 7 x weekly - 1 sets - 10 reps - 5 hold - Supine Heel Slide with Strap  - 3-5 x daily - 7 x weekly - 1 sets - 10 reps - 5 hold - Seated Long Arc Quad (Mirrored)  - 3-5 x daily - 7 x weekly - 1 sets - 5-10 reps - 2 hold - Supine Knee Extension Mobilization with Weight (Mirrored)  - 4-5 x daily - 7 x weekly - 1 sets - 1 reps - to tolerance up to 15 mins hold - Seated Quad Set (Mirrored)  - 3-5 x daily - 7 x weekly - 1 sets - 10 reps - 5 hold - Seated Knee Flexion AAROM (Mirrored)  - 3-5 x daily - 7 x weekly - 1 sets - 5 reps - 10-15 hold  ASSESSMENT:  CLINICAL IMPRESSION: She appears to be improving with knee ROM with visual assessment. Continued efforts on this with strength progressions with good tolerance noted. We will monitor for any soreness to progressions and adjust  PRN.   OBJECTIVE IMPAIRMENTS: Abnormal gait, decreased activity tolerance, decreased balance, decreased coordination, decreased endurance, decreased mobility, difficulty walking, decreased ROM, decreased strength, hypomobility, increased edema, increased fascial restrictions, impaired perceived functional ability, increased muscle spasms, impaired flexibility, improper body mechanics, and pain.   ACTIVITY LIMITATIONS: carrying, lifting, bending, sitting, standing, squatting, sleeping, stairs, transfers, bed mobility, and locomotion level  PARTICIPATION LIMITATIONS: meal prep, cleaning, laundry, interpersonal relationship, driving, shopping, and community activity  PERSONAL FACTORS:  History of arthritis, DM, GERD, HTN, hyperthyroidism, IBS,   are also affecting patient's functional outcome.   REHAB POTENTIAL: Good  CLINICAL DECISION MAKING: Stable/uncomplicated  EVALUATION COMPLEXITY: Low   GOALS: Goals reviewed with patient? Yes  SHORT TERM GOALS: (target date for Short term goals are 3 weeks 11/02/2023)   1.  Patient will demonstrate independent use of home exercise program to maintain progress from in clinic treatments.  Goal status: Met   LONG TERM GOALS: (target dates for all long term goals are 12 weeks  01/04/2024 )   1. Patient will demonstrate/report pain at worst less than or equal to 2/10 to facilitate minimal limitation in daily activity secondary to pain symptoms.  Goal status: on going 10/26/2023   2. Patient will demonstrate independent use of home exercise program to facilitate ability to maintain/progress functional gains from skilled physical therapy services.  Goal status:  on going 10/26/2023   3. Patient will demonstrate FOTO outcome > or = 55 % to indicate reduced disability due to condition.  Goal status:  on going 10/26/2023   4.  Patient will demonstrate Lt LE MMT 5/5 throughout to faciltiate usual transfers, stairs, squatting at [facility] for daily life.    Goal status:  on going 10/26/2023   5.  Patient will demonstrate Lt knee AROM 0-110 deg to facilitate usual transfers, ambulation and other daily activity at PLOF Goal status:  on going 10/26/2023   6.  Patient will demonstrate independent ambulation community distances > 300 ft for community integration Goal status:  on going 10/26/2023  7. Patient will demonstrate ascending/descending stairs reciprocally s UE assist for community integration.   Goal Status:  on going 10/26/2023   PLAN:  PT FREQUENCY: 1-2x/week  PT DURATION: 12 weeks  PLANNED INTERVENTIONS: Can include 21308- PT Re-evaluation, 97110-Therapeutic exercises, 97530- Therapeutic activity, 97112- Neuromuscular re-education, 97535- Self Care, 97140- Manual therapy, [phone]- Gait training, 97014- Electrical stimulation (unattended),  97016- Vasopneumatic device,   Patient/Family education, Balance training, Stair training, Taping, Dry Needling, Joint mobilization, Joint manipulation, Spinal manipulation, Spinal mobilization, Scar mobilization, Vestibular training, Visual/preceptual remediation/compensation, DME instructions, Cryotherapy, and Moist heat.  All performed as medically necessary.  All included unless contraindicated  PLAN FOR NEXT SESSION:  WB strengthening, compliant balance challenges.   Ivery Quale, PT, DPT 11/10/23 4:23 PM        Referring diagnosis? O96.295 (ICD-10-CM) - Status post total knee replacement, left Treatment diagnosis? (if different than referring diagnosis) M25.562 What was this (referring dx) caused by? [x]  Surgery []  Fall []  Ongoing issue []  Arthritis []  Other: ____________  Laterality: []  Rt [x]  Lt []  Both  Check all possible CPT codes:  *CHOOSE 10 OR LESS*    See Planned Interventions listed in the Plan section of the Evaluation.

## 2023-11-12 ENCOUNTER — Ambulatory Visit: Payer: Medicare HMO | Admitting: Rehabilitative and Restorative Service Providers"

## 2023-11-12 ENCOUNTER — Encounter: Payer: Self-pay | Admitting: Rehabilitative and Restorative Service Providers"

## 2023-11-12 DIAGNOSIS — M25662 Stiffness of left knee, not elsewhere classified: Secondary | ICD-10-CM

## 2023-11-12 DIAGNOSIS — M6281 Muscle weakness (generalized): Secondary | ICD-10-CM | POA: Diagnosis not present

## 2023-11-12 DIAGNOSIS — R262 Difficulty in walking, not elsewhere classified: Secondary | ICD-10-CM | POA: Diagnosis not present

## 2023-11-12 DIAGNOSIS — G8929 Other chronic pain: Secondary | ICD-10-CM

## 2023-11-12 DIAGNOSIS — M25562 Pain in left knee: Secondary | ICD-10-CM

## 2023-11-12 NOTE — Therapy (Signed)
OUTPATIENT PHYSICAL THERAPY  TREATMENT   Patient Name: Denise Ferguson MRN: [id number] DOB:Feb 15, 1951, 72 y.o., female Today's Date: 11/12/2023  END OF SESSION:  PT End of Session - 11/12/23 1507     Visit Number 10    Number of Visits 24    Date for PT Re-Evaluation 01/04/24    Authorization Type [employer] $25 copay    Authorization Time Period 10/12/2023 -01/03/2023    Authorization - Visit Number 10    Authorization - Number of Visits 12    Progress Note Due on Visit 13    PT Start Time 1507    PT Stop Time 1547    PT Time Calculation (min) 40 min    Activity Tolerance Patient tolerated treatment well    Behavior During Therapy WFL for tasks assessed/performed                    Past Medical History:  Diagnosis Date   Allergy    Anemia    Arthritis    knees   Diabetes mellitus without complication (HCC)    " Pre" DM- on Metformin    FHx: migraine headaches    GERD (gastroesophageal reflux disease)    Grave's disease    History of colon polyps    Hx of migraines    Hypertension    Hyperthyroidism    IBS (irritable bowel syndrome)    PONV (postoperative nausea and vomiting)    Pre-diabetes    PSVT (paroxysmal supraventricular tachycardia) (HCC)    Past Surgical History:  Procedure Laterality Date   ABDOMINAL HYSTERECTOMY     BREAST BIOPSY Left    CARPAL TUNNEL RELEASE  2000   bilateral   CATARACT EXTRACTION Bilateral 03/20/2021   COLONOSCOPY     KNEE ARTHROSCOPY WITH LATERAL MENISECTOMY  07/16/2021   Procedure: KNEE ARTHROSCOPY WITH LATERAL MENISECTOMY;  Surgeon: Nadara Mustard, MD;  Location: [facility];  Service: Orthopedics;;   KNEE ARTHROSCOPY WITH MEDIAL MENISECTOMY Right 07/16/2021   Procedure: ARTHROSCOPIC DEBRIDEMENT RIGHT KNEE, PARTIAL MEDIAL AND LATERAL MENISECTOMIES;  Surgeon: Nadara Mustard, MD;  Location: [facility];  Service: Orthopedics;  Laterality: Right;   POLYPECTOMY     thyroid ablation     x2    TONSILLECTOMY     TOTAL KNEE ARTHROPLASTY Left 09/16/2023   Procedure: LEFT TOTAL KNEE ARTHROPLASTY;  Surgeon: Nadara Mustard, MD;  Location: [facility] OR;  Service: Orthopedics;  Laterality: Left;   TUBAL LIGATION  1980   Patient Active Problem List   Diagnosis Date Noted   Primary osteoarthritis of left knee 09/16/2023   Total knee replacement status, left 09/16/2023   Preop cardiovascular exam 08/27/2023   Retrosternal discomfort 05/01/2023   PSVT (paroxysmal supraventricular tachycardia) (HCC) 01/29/2023   History of meniscal tear    History of colon polyps 09/23/2011   GERD (gastroesophageal reflux disease) 09/23/2011   Graves' disease 09/23/2011   HTN (hypertension) 09/23/2011   Migraines 09/23/2011   Anemia 09/23/2011    PCP: [name] NP  REFERRING PROVIDER: Nadara Mustard, MD  REFERRING DIAG: [phone] (ICD-10-CM) - Status post total knee replacement, left  THERAPY DIAG:  Chronic pain of left knee  Stiffness of left knee, not elsewhere classified  Muscle weakness (generalized)  Difficulty in walking, not elsewhere classified  Rationale for Evaluation and Treatment: Rehabilitation  ONSET DATE: Surgery 09/16/2023  SUBJECTIVE:   SUBJECTIVE STATEMENT: Pt indicated tightness today but overall not hurting specifically.   PERTINENT HISTORY:  History of arthritis, DM, GERD, HTN, hyperthyroidism, IBS,   PAIN:  NPRS scale: 2/10  Pain location: Lt knee Pain description: shooting, tightness Aggravating factors:  end ranges, static positioning for tightness, nighttime pains, WB Relieving factors: ice  PRECAUTIONS: None  WEIGHT BEARING RESTRICTIONS: No  FALLS:  Has patient fallen in last 6 months? No  LIVING ENVIRONMENT: Lives with: Lives alone Lives in: House/apartment Stairs: no stairs at home Has following equipment at home: FWW  Daughter lives on a 3rd floor.   OCCUPATION: Retired  PLOF: Independent, shopping, housework.    PATIENT GOALS: Reduce  pain, get back to walking.   OBJECTIVE:   PATIENT SURVEYS:  11/03/2023: update:  52 10/12/2023 FOTO intake: 47   predicted:  55  COGNITION: 10/12/2023 Overall cognitive status: WFL    SENSATION: 10/12/2023 Not tested  EDEMA:  10/12/2023 Localized edema noted Lt knee, lower leg.   MUSCLE LENGTH: 10/12/2023 No specific testing  POSTURE:  10/12/2023 No Significant postural limitations  PALPATION: 10/12/2023 General tenderness to light touch noted.   LOWER EXTREMITY ROM:   ROM Right 10/12/2023 Left 10/12/2023 Left 10/16/2023 Left 10/18/24 Left 10/26/2023 Left 11/12/2023  Hip flexion        Hip extension        Hip abduction        Hip adduction        Hip internal rotation        Hip external rotation        Knee flexion  85 AROM in supine heel slide 91 AROM in supine heel slide   102 AROM in supine heel slide 109 AROM in supine heel slide  Knee extension  -17 AROM in seated LAQ    0 deg PROM supine -4 AROM in seated LAQ 0 AROM in seated LAQ  Ankle dorsiflexion        Ankle plantarflexion        Ankle inversion        Ankle eversion         (Blank rows = not tested)  LOWER EXTREMITY MMT:  MMT Right 10/12/2023 Left 10/12/2023 Left 10/26/2023  Hip flexion 5/5 4/5 5/5  Hip extension     Hip abduction     Hip adduction     Hip internal rotation     Hip external rotation     Knee flexion 5/5 3+/5 5/5  Knee extension 5/5 2+/5 4+/5  Ankle dorsiflexion 5/5 4/5 5/5  Ankle plantarflexion     Ankle inversion     Ankle eversion      (Blank rows = not tested)  LOWER EXTREMITY SPECIAL TESTS:  10/12/2023 No specific testing today  FUNCTIONAL TESTS:  10/26/2023:   TUG: FWW: : 19.5 seconds.  SPC: 30 seconds   10/12/2023 TUG:  FWW: 19.76 seconds  18 inch chair transfer: able without UE on chair but on knees and deviation to Rt leg.  Lt SLS: unable Rt SLS: < 3 seconds  GAIT: 11/03/2023: independent ambulation   10/26/2023: Able to walk mod  independent with SPC c good control.  Able to do household distances in clinic independent.   10/12/2023 With FWW lacking full TKE, reduced stance  TODAY'S TREATMENT                                                                          DATE: 11/12/2023 Therex: UBE UE/LE for ROM, lvl 2.5 seat 10   - 10 mins  Supine Lt knee AROM heel slide 5 sec hold x 10   Incline gastroc stretch 30 sec x 5 bilaterally  Lateral stepping in // bars 10 ft x 3 each way   TherActivity Leg press double leg 75 lbs x 15, single leg Lt 37 lbs 2 x 10  Neuro Re-ed Alternating toe/heel lifts on foam 2 x 15 SLS with corner touching on foam x 6 each, performed bilaterally     TODAY'S TREATMENT                                                                          DATE:  11/10/2023 Therex: Nustep Lvl 6 8 mins seat #8 UE/LE for ROM Incline gastroc stretch 30 sec x 3 bilateral Seated knee extension machine 5# DL [id number] stretching into max tolerated knee flexion Seated knee flexion machine 10# Lt LE only 2X10 Seated knee flexion AAROM 5 sec X 10  TherActivity Leg press double leg 75 lbs x 15, single leg Lt 31 lbs 2 x 15  Flight of stairs in clinic up/down with single rail assist and reciprocal gait pattern  Manual therapy Left knee PROM into flexion with overpressure to tolerance  Modalaties -Vasopnuematic device X 10 min, medium compression, 34 deg to Lt knee   TODAY'S TREATMENT                                                                          DATE: 11/05/2023 Therex: Recumbent bike attempt for 2 mins but limited due to end range stretch too much despite cues for partial circles.  Nustep Lvl 6 8 mins UE/LE for ROM Incline gastroc stretch 30 sec x 3 bilateral Seated tailgate flexion swinging Lt knee 1 min   TherActivity Leg  press double leg 75 lbs x 15, single leg Lt 31 lbs 2 x 15  Step on over and down WB on lt leg 4 inch step with single rail assist x 15 Flight of stairs in clinic up/down with single rail assist and reciprocal gait pattern   Manual Percussive device to Lt quad/lateral thigh for soft tissue mobilization with knee flexion mobility.     TODAY'S TREATMENT  DATE:11/03/2023 Therex: UBE UE/LE for ROM 10 mins lvl 1.0, seat 9  Incline gastroc stretch 30 sec x 3 bilateral Leg press double leg 75 lbs x 15, single leg Lt 31 lbs 2 x 15  Seated Lt leg LAQ x 15   Neuro Re-ed Tandem stance on foam 1 min x 2 bilateral  SLS with vector reach light touch fwd, side, and back x 8 each WB on Lt leg   Manual Percussive device to Lt quad/lateral thigh for soft tissue mobilization.  Cross friction massage perforance and instruction.   Vaso Deferred today in trial run without.     PATIENT EDUCATION:  10/12/2023 Education details: HEP, POC Person educated: Patient Education method: Programmer, multimedia, Demonstration, Verbal cues, and Handouts Education comprehension: verbalized understanding, returned demonstration, and verbal cues required  HOME EXERCISE PROGRAM: Access Code: [email or web address] URL: [email or web address] Date: 10/12/2023 Prepared by: Chyrel Masson  Exercises - Supine Heel Slide (Mirrored)  - 3-5 x daily - 7 x weekly - 1 sets - 10 reps - 5 hold - Supine Heel Slide with Strap  - 3-5 x daily - 7 x weekly - 1 sets - 10 reps - 5 hold - Seated Long Arc Quad (Mirrored)  - 3-5 x daily - 7 x weekly - 1 sets - 5-10 reps - 2 hold - Supine Knee Extension Mobilization with Weight (Mirrored)  - 4-5 x daily - 7 x weekly - 1 sets - 1 reps - to tolerance up to 15 mins hold - Seated Quad Set (Mirrored)  - 3-5 x daily - 7 x weekly - 1 sets - 10 reps - 5 hold - Seated Knee Flexion AAROM (Mirrored)  - 3-5 x daily - 7 x weekly - 1  sets - 5 reps - 10-15 hold  ASSESSMENT:  CLINICAL IMPRESSION: Rom continued to show improvement.  Flexion quality still tight but number kept getting closer to goal.  Continued efforts on improved strength in WB to benefit ambulation and WB activity.   OBJECTIVE IMPAIRMENTS: Abnormal gait, decreased activity tolerance, decreased balance, decreased coordination, decreased endurance, decreased mobility, difficulty walking, decreased ROM, decreased strength, hypomobility, increased edema, increased fascial restrictions, impaired perceived functional ability, increased muscle spasms, impaired flexibility, improper body mechanics, and pain.   ACTIVITY LIMITATIONS: carrying, lifting, bending, sitting, standing, squatting, sleeping, stairs, transfers, bed mobility, and locomotion level  PARTICIPATION LIMITATIONS: meal prep, cleaning, laundry, interpersonal relationship, driving, shopping, and community activity  PERSONAL FACTORS:  History of arthritis, DM, GERD, HTN, hyperthyroidism, IBS,   are also affecting patient's functional outcome.   REHAB POTENTIAL: Good  CLINICAL DECISION MAKING: Stable/uncomplicated  EVALUATION COMPLEXITY: Low   GOALS: Goals reviewed with patient? Yes  SHORT TERM GOALS: (target date for Short term goals are 3 weeks 11/02/2023)   1.  Patient will demonstrate independent use of home exercise program to maintain progress from in clinic treatments.  Goal status: Met   LONG TERM GOALS: (target dates for all long term goals are 12 weeks  01/04/2024 )   1. Patient will demonstrate/report pain at worst less than or equal to 2/10 to facilitate minimal limitation in daily activity secondary to pain symptoms.  Goal status: on going 10/26/2023   2. Patient will demonstrate independent use of home exercise program to facilitate ability to maintain/progress functional gains from skilled physical therapy services.  Goal status:  on going 10/26/2023   3. Patient will  demonstrate FOTO outcome > or = 55 % to indicate reduced disability due to condition.  Goal status:  on going 10/26/2023   4.  Patient will demonstrate Lt LE MMT 5/5 throughout to faciltiate usual transfers, stairs, squatting at [facility] for daily life.   Goal status:  on going 10/26/2023   5.  Patient will demonstrate Lt knee AROM 0-110 deg to facilitate usual transfers, ambulation and other daily activity at PLOF Goal status:  on going 10/26/2023   6.  Patient will demonstrate independent ambulation community distances > 300 ft for community integration Goal status:  on going 10/26/2023   7. Patient will demonstrate ascending/descending stairs reciprocally s UE assist for community integration.   Goal Status:  on going 10/26/2023   PLAN:  PT FREQUENCY: 1-2x/week  PT DURATION: 12 weeks  PLANNED INTERVENTIONS: Can include 65784- PT Re-evaluation, 97110-Therapeutic exercises, 97530- Therapeutic activity, 97112- Neuromuscular re-education, 97535- Self Care, 97140- Manual therapy, [phone]- Gait training, 97014- Electrical stimulation (unattended),  97016- Vasopneumatic device,   Patient/Family education, Balance training, Stair training, Taping, Dry Needling, Joint mobilization, Joint manipulation, Spinal manipulation, Spinal mobilization, Scar mobilization, Vestibular training, Visual/preceptual remediation/compensation, DME instructions, Cryotherapy, and Moist heat.  All performed as medically necessary.  All included unless contraindicated  PLAN FOR NEXT SESSION:  Balance and strengthening.   Chyrel Masson, PT, DPT, OCS, ATC 11/12/23  3:56 PM      Referring diagnosis? B28.413 (ICD-10-CM) - Status post total knee replacement, left Treatment diagnosis? (if different than referring diagnosis) M25.562 What was this (referring dx) caused by? [x]  Surgery []  Fall []  Ongoing issue []  Arthritis []  Other: ____________  Laterality: []  Rt [x]  Lt []  Both  Check all possible CPT  codes:  *CHOOSE 10 OR LESS*    See Planned Interventions listed in the Plan section of the Evaluation.

## 2023-11-16 ENCOUNTER — Encounter: Payer: Self-pay | Admitting: Physical Therapy

## 2023-11-16 ENCOUNTER — Ambulatory Visit: Payer: Medicare HMO | Admitting: Physical Therapy

## 2023-11-16 DIAGNOSIS — M25662 Stiffness of left knee, not elsewhere classified: Secondary | ICD-10-CM | POA: Diagnosis not present

## 2023-11-16 DIAGNOSIS — M6281 Muscle weakness (generalized): Secondary | ICD-10-CM | POA: Diagnosis not present

## 2023-11-16 DIAGNOSIS — R262 Difficulty in walking, not elsewhere classified: Secondary | ICD-10-CM

## 2023-11-16 DIAGNOSIS — M25562 Pain in left knee: Secondary | ICD-10-CM | POA: Diagnosis not present

## 2023-11-16 DIAGNOSIS — G8929 Other chronic pain: Secondary | ICD-10-CM

## 2023-11-16 NOTE — Therapy (Signed)
OUTPATIENT PHYSICAL THERAPY  TREATMENT   Patient Name: Denise Ferguson MRN: [id number] DOB:May 12, 1951, 72 y.o., female Today's Date: 11/16/2023  END OF SESSION:  PT End of Session - 11/16/23 1107     Visit Number 11    Number of Visits 24    Date for PT Re-Evaluation 01/04/24    Authorization Type [employer] $25 copay    Authorization Time Period 10/12/2023 -01/03/2023    Authorization - Visit Number 11    Authorization - Number of Visits 12    Progress Note Due on Visit 13    PT Start Time 1055    PT Stop Time 1133    PT Time Calculation (min) 38 min    Activity Tolerance Patient tolerated treatment well    Behavior During Therapy WFL for tasks assessed/performed                    Past Medical History:  Diagnosis Date   Allergy    Anemia    Arthritis    knees   Diabetes mellitus without complication (HCC)    " Pre" DM- on Metformin    FHx: migraine headaches    GERD (gastroesophageal reflux disease)    Grave's disease    History of colon polyps    Hx of migraines    Hypertension    Hyperthyroidism    IBS (irritable bowel syndrome)    PONV (postoperative nausea and vomiting)    Pre-diabetes    PSVT (paroxysmal supraventricular tachycardia) (HCC)    Past Surgical History:  Procedure Laterality Date   ABDOMINAL HYSTERECTOMY     BREAST BIOPSY Left    CARPAL TUNNEL RELEASE  2000   bilateral   CATARACT EXTRACTION Bilateral 03/20/2021   COLONOSCOPY     KNEE ARTHROSCOPY WITH LATERAL MENISECTOMY  07/16/2021   Procedure: KNEE ARTHROSCOPY WITH LATERAL MENISECTOMY;  Surgeon: Nadara Mustard, MD;  Location: [facility];  Service: Orthopedics;;   KNEE ARTHROSCOPY WITH MEDIAL MENISECTOMY Right 07/16/2021   Procedure: ARTHROSCOPIC DEBRIDEMENT RIGHT KNEE, PARTIAL MEDIAL AND LATERAL MENISECTOMIES;  Surgeon: Nadara Mustard, MD;  Location: [facility];  Service: Orthopedics;  Laterality: Right;   POLYPECTOMY     thyroid ablation     x2    TONSILLECTOMY     TOTAL KNEE ARTHROPLASTY Left 09/16/2023   Procedure: LEFT TOTAL KNEE ARTHROPLASTY;  Surgeon: Nadara Mustard, MD;  Location: [facility] OR;  Service: Orthopedics;  Laterality: Left;   TUBAL LIGATION  1980   Patient Active Problem List   Diagnosis Date Noted   Primary osteoarthritis of left knee 09/16/2023   Total knee replacement status, left 09/16/2023   Preop cardiovascular exam 08/27/2023   Retrosternal discomfort 05/01/2023   PSVT (paroxysmal supraventricular tachycardia) (HCC) 01/29/2023   History of meniscal tear    History of colon polyps 09/23/2011   GERD (gastroesophageal reflux disease) 09/23/2011   Graves' disease 09/23/2011   HTN (hypertension) 09/23/2011   Migraines 09/23/2011   Anemia 09/23/2011    PCP: [name] NP  REFERRING PROVIDER: Nadara Mustard, MD  REFERRING DIAG: [phone] (ICD-10-CM) - Status post total knee replacement, left  THERAPY DIAG:  Chronic pain of left knee  Stiffness of left knee, not elsewhere classified  Muscle weakness (generalized)  Difficulty in walking, not elsewhere classified  Rationale for Evaluation and Treatment: Rehabilitation  ONSET DATE: Surgery 09/16/2023  SUBJECTIVE:   SUBJECTIVE STATEMENT: Pt relays not much pain today  PERTINENT HISTORY: History of arthritis, DM,  GERD, HTN, hyperthyroidism, IBS,   PAIN:  NPRS scale: 2/10  Pain location: Lt knee Pain description: shooting, tightness Aggravating factors:  end ranges, static positioning for tightness, nighttime pains, WB Relieving factors: ice  PRECAUTIONS: None  WEIGHT BEARING RESTRICTIONS: No  FALLS:  Has patient fallen in last 6 months? No  LIVING ENVIRONMENT: Lives with: Lives alone Lives in: House/apartment Stairs: no stairs at home Has following equipment at home: FWW  Daughter lives on a 3rd floor.   OCCUPATION: Retired  PLOF: Independent, shopping, housework.    PATIENT GOALS: Reduce pain, get back to walking.   OBJECTIVE:    PATIENT SURVEYS:  11/03/2023: update:  52 10/12/2023 FOTO intake: 47   predicted:  55  COGNITION: 10/12/2023 Overall cognitive status: WFL    SENSATION: 10/12/2023 Not tested  EDEMA:  10/12/2023 Localized edema noted Lt knee, lower leg.   MUSCLE LENGTH: 10/12/2023 No specific testing  POSTURE:  10/12/2023 No Significant postural limitations  PALPATION: 10/12/2023 General tenderness to light touch noted.   LOWER EXTREMITY ROM:   ROM Right 10/12/2023 Left 10/12/2023 Left 10/16/2023 Left 10/18/24 Left 10/26/2023 Left 11/12/2023  Hip flexion        Hip extension        Hip abduction        Hip adduction        Hip internal rotation        Hip external rotation        Knee flexion  85 AROM in supine heel slide 91 AROM in supine heel slide   102 AROM in supine heel slide 109 AROM in supine heel slide  Knee extension  -17 AROM in seated LAQ    0 deg PROM supine -4 AROM in seated LAQ 0 AROM in seated LAQ  Ankle dorsiflexion        Ankle plantarflexion        Ankle inversion        Ankle eversion         (Blank rows = not tested)  LOWER EXTREMITY MMT:  MMT Right 10/12/2023 Left 10/12/2023 Left 10/26/2023  Hip flexion 5/5 4/5 5/5  Hip extension     Hip abduction     Hip adduction     Hip internal rotation     Hip external rotation     Knee flexion 5/5 3+/5 5/5  Knee extension 5/5 2+/5 4+/5  Ankle dorsiflexion 5/5 4/5 5/5  Ankle plantarflexion     Ankle inversion     Ankle eversion      (Blank rows = not tested)  LOWER EXTREMITY SPECIAL TESTS:  10/12/2023 No specific testing today  FUNCTIONAL TESTS:  10/26/2023:   TUG: FWW: : 19.5 seconds.  SPC: 30 seconds   10/12/2023 TUG:  FWW: 19.76 seconds  18 inch chair transfer: able without UE on chair but on knees and deviation to Rt leg.  Lt SLS: unable Rt SLS: < 3 seconds  GAIT: 11/03/2023: independent ambulation   10/26/2023: Able to walk mod independent with SPC c good control.  Able to do  household distances in clinic independent.   10/12/2023 With FWW lacking full TKE, reduced stance  TODAY'S TREATMENT                                                                          DATE:  11/16/2023 Therex: UBE UE/LE for ROM, lvl 2.5 seat 10   - 10 mins  Seated  Lt knee AAROM heel slide 10 sec hold x 10   Incline gastroc stretch 30 sec x 5 bilaterally  Lateral stepping in // bars 10 ft x 3 each way  Leg press double leg 75 lbs x 2X10, single leg Lt 37 lbs 2 x 10  Manual therapy Left knee PROM to tolerance with overpressure in sitting  Neuro Re-ed Alternating toe/heel lifts on foam  x 15 SLS with corner touching on foam x 6 each, performed bilaterally   11/12/2023 Therex: UBE UE/LE for ROM, lvl 2.5 seat 10   - 10 mins  Supine Lt knee AROM heel slide 5 sec hold x 10   Incline gastroc stretch 30 sec x 5 bilaterally  Lateral stepping in // bars 10 ft x 3 each way   TherActivity Leg press double leg 75 lbs x 15, single leg Lt 37 lbs 2 x 10  Neuro Re-ed Alternating toe/heel lifts on foam 2 x 15 SLS with corner touching on foam x 6 each, performed bilaterally     TODAY'S TREATMENT                                                                          DATE:  11/10/2023 Therex: Nustep Lvl 6 8 mins seat #8 UE/LE for ROM Incline gastroc stretch 30 sec x 3 bilateral Seated knee extension machine 5# DL [id number] stretching into max tolerated knee flexion Seated knee flexion machine 10# Lt LE only 2X10 Seated knee flexion AAROM 5 sec X 10  TherActivity Leg press double leg 75 lbs x 15, single leg Lt 31 lbs 2 x 15  Flight of stairs in clinic up/down with single rail assist and reciprocal gait pattern  Manual therapy Left knee PROM into flexion with overpressure to tolerance  Modalaties -Vasopnuematic device  X 10 min, medium compression, 34 deg to Lt knee       PATIENT EDUCATION:  10/12/2023 Education details: HEP, POC Person educated: Patient Education method: Programmer, multimedia, Facilities manager, Verbal cues, and Handouts Education comprehension: verbalized understanding, returned demonstration, and verbal cues required  HOME EXERCISE PROGRAM: Access Code: [email or web address] URL: [email or web address] Date: 10/12/2023 Prepared by: Chyrel Masson  Exercises - Supine Heel Slide (Mirrored)  - 3-5 x daily - 7 x weekly - 1 sets - 10 reps - 5 hold - Supine Heel Slide with Strap  - 3-5 x daily - 7 x weekly - 1 sets - 10 reps - 5 hold - Seated Long Arc Quad (Mirrored)  - 3-5 x daily - 7 x weekly - 1 sets - 5-10 reps - 2 hold - Supine Knee Extension Mobilization with Weight (Mirrored)  - 4-5 x daily - 7 x  weekly - 1 sets - 1 reps - to tolerance up to 15 mins hold - Seated Quad Set (Mirrored)  - 3-5 x daily - 7 x weekly - 1 sets - 10 reps - 5 hold - Seated Knee Flexion AAROM (Mirrored)  - 3-5 x daily - 7 x weekly - 1 sets - 5 reps - 10-15 hold  ASSESSMENT:  CLINICAL IMPRESSION: She appears to be doing well overall with her knee in regards to PT. She has one more visit scheduled so will assess readiness to discharge.  OBJECTIVE IMPAIRMENTS: Abnormal gait, decreased activity tolerance, decreased balance, decreased coordination, decreased endurance, decreased mobility, difficulty walking, decreased ROM, decreased strength, hypomobility, increased edema, increased fascial restrictions, impaired perceived functional ability, increased muscle spasms, impaired flexibility, improper body mechanics, and pain.   ACTIVITY LIMITATIONS: carrying, lifting, bending, sitting, standing, squatting, sleeping, stairs, transfers, bed mobility, and locomotion level  PARTICIPATION LIMITATIONS: meal prep, cleaning, laundry, interpersonal relationship, driving, shopping, and community activity  PERSONAL FACTORS:   History of arthritis, DM, GERD, HTN, hyperthyroidism, IBS,   are also affecting patient's functional outcome.   REHAB POTENTIAL: Good  CLINICAL DECISION MAKING: Stable/uncomplicated  EVALUATION COMPLEXITY: Low   GOALS: Goals reviewed with patient? Yes  SHORT TERM GOALS: (target date for Short term goals are 3 weeks 11/02/2023)   1.  Patient will demonstrate independent use of home exercise program to maintain progress from in clinic treatments.  Goal status: Met   LONG TERM GOALS: (target dates for all long term goals are 12 weeks  01/04/2024 )   1. Patient will demonstrate/report pain at worst less than or equal to 2/10 to facilitate minimal limitation in daily activity secondary to pain symptoms.  Goal status: on going 10/26/2023   2. Patient will demonstrate independent use of home exercise program to facilitate ability to maintain/progress functional gains from skilled physical therapy services.  Goal status:  on going 10/26/2023   3. Patient will demonstrate FOTO outcome > or = 55 % to indicate reduced disability due to condition.  Goal status:  on going 10/26/2023   4.  Patient will demonstrate Lt LE MMT 5/5 throughout to faciltiate usual transfers, stairs, squatting at [facility] for daily life.   Goal status:  on going 10/26/2023   5.  Patient will demonstrate Lt knee AROM 0-110 deg to facilitate usual transfers, ambulation and other daily activity at PLOF Goal status:  on going 10/26/2023   6.  Patient will demonstrate independent ambulation community distances > 300 ft for community integration Goal status:  on going 10/26/2023   7. Patient will demonstrate ascending/descending stairs reciprocally s UE assist for community integration.   Goal Status:  on going 10/26/2023   PLAN:  PT FREQUENCY: 1-2x/week  PT DURATION: 12 weeks  PLANNED INTERVENTIONS: Can include 09811- PT Re-evaluation, 97110-Therapeutic exercises, 97530- Therapeutic activity, 97112- Neuromuscular  re-education, 97535- Self Care, 97140- Manual therapy, [phone]- Gait training, 97014- Electrical stimulation (unattended),  97016- Vasopneumatic device,   Patient/Family education, Balance training, Stair training, Taping, Dry Needling, Joint mobilization, Joint manipulation, Spinal manipulation, Spinal mobilization, Scar mobilization, Vestibular training, Visual/preceptual remediation/compensation, DME instructions, Cryotherapy, and Moist heat.  All performed as medically necessary.  All included unless contraindicated  PLAN FOR NEXT SESSION:  assess if she is ready to transition to independent program.   Ivery Quale, PT, DPT 11/16/23 11:35 AM       Referring diagnosis? G95.621 (ICD-10-CM) - Status post total knee replacement, left Treatment diagnosis? (if different than referring diagnosis) M25.562  What was this (referring dx) caused by? [x]  Surgery []  Fall []  Ongoing issue []  Arthritis []  Other: ____________  Laterality: []  Rt [x]  Lt []  Both  Check all possible CPT codes:  *CHOOSE 10 OR LESS*    See Planned Interventions listed in the Plan section of the Evaluation.

## 2023-11-19 ENCOUNTER — Ambulatory Visit: Payer: Medicare HMO | Admitting: Rehabilitative and Restorative Service Providers"

## 2023-11-19 ENCOUNTER — Encounter: Payer: Self-pay | Admitting: Rehabilitative and Restorative Service Providers"

## 2023-11-19 DIAGNOSIS — M25562 Pain in left knee: Secondary | ICD-10-CM | POA: Diagnosis not present

## 2023-11-19 DIAGNOSIS — M6281 Muscle weakness (generalized): Secondary | ICD-10-CM | POA: Diagnosis not present

## 2023-11-19 DIAGNOSIS — G8929 Other chronic pain: Secondary | ICD-10-CM

## 2023-11-19 DIAGNOSIS — M25662 Stiffness of left knee, not elsewhere classified: Secondary | ICD-10-CM | POA: Diagnosis not present

## 2023-11-19 DIAGNOSIS — R262 Difficulty in walking, not elsewhere classified: Secondary | ICD-10-CM

## 2023-11-19 NOTE — Therapy (Addendum)
OUTPATIENT PHYSICAL THERAPY  TREATMENT / DISCHARGE   Patient Name: Denise Ferguson MRN: [id number] DOB:Jun 27, 1951, 72 y.o., female Today's Date: 11/19/2023  END OF SESSION:  PT End of Session - 11/19/23 1047     Visit Number 12    Number of Visits 24    Date for PT Re-Evaluation 01/04/24    Authorization Type [employer] $25 copay    Authorization Time Period 10/12/2023 -01/03/2023    Authorization - Visit Number 12    Authorization - Number of Visits 12    Progress Note Due on Visit 13    PT Start Time 1048    PT Stop Time 1137    PT Time Calculation (min) 49 min    Activity Tolerance Patient tolerated treatment well    Behavior During Therapy WFL for tasks assessed/performed                     Past Medical History:  Diagnosis Date   Allergy    Anemia    Arthritis    knees   Diabetes mellitus without complication (HCC)    " Pre" DM- on Metformin    FHx: migraine headaches    GERD (gastroesophageal reflux disease)    Grave's disease    History of colon polyps    Hx of migraines    Hypertension    Hyperthyroidism    IBS (irritable bowel syndrome)    PONV (postoperative nausea and vomiting)    Pre-diabetes    PSVT (paroxysmal supraventricular tachycardia) (HCC)    Past Surgical History:  Procedure Laterality Date   ABDOMINAL HYSTERECTOMY     BREAST BIOPSY Left    CARPAL TUNNEL RELEASE  2000   bilateral   CATARACT EXTRACTION Bilateral 03/20/2021   COLONOSCOPY     KNEE ARTHROSCOPY WITH LATERAL MENISECTOMY  07/16/2021   Procedure: KNEE ARTHROSCOPY WITH LATERAL MENISECTOMY;  Surgeon: Nadara Mustard, MD;  Location: [facility];  Service: Orthopedics;;   KNEE ARTHROSCOPY WITH MEDIAL MENISECTOMY Right 07/16/2021   Procedure: ARTHROSCOPIC DEBRIDEMENT RIGHT KNEE, PARTIAL MEDIAL AND LATERAL MENISECTOMIES;  Surgeon: Nadara Mustard, MD;  Location: [facility];  Service: Orthopedics;  Laterality: Right;   POLYPECTOMY     thyroid ablation      x2   TONSILLECTOMY     TOTAL KNEE ARTHROPLASTY Left 09/16/2023   Procedure: LEFT TOTAL KNEE ARTHROPLASTY;  Surgeon: Nadara Mustard, MD;  Location: [facility] OR;  Service: Orthopedics;  Laterality: Left;   TUBAL LIGATION  1980   Patient Active Problem List   Diagnosis Date Noted   Primary osteoarthritis of left knee 09/16/2023   Total knee replacement status, left 09/16/2023   Preop cardiovascular exam 08/27/2023   Retrosternal discomfort 05/01/2023   PSVT (paroxysmal supraventricular tachycardia) (HCC) 01/29/2023   History of meniscal tear    History of colon polyps 09/23/2011   GERD (gastroesophageal reflux disease) 09/23/2011   Graves' disease 09/23/2011   HTN (hypertension) 09/23/2011   Migraines 09/23/2011   Anemia 09/23/2011    PCP: [name] NP  REFERRING PROVIDER: Nadara Mustard, MD  REFERRING DIAG: [phone] (ICD-10-CM) - Status post total knee replacement, left  THERAPY DIAG:  Chronic pain of left knee  Stiffness of left knee, not elsewhere classified  Muscle weakness (generalized)  Difficulty in walking, not elsewhere classified  Rationale for Evaluation and Treatment: Rehabilitation  ONSET DATE: Surgery 09/16/2023  SUBJECTIVE:   SUBJECTIVE STATEMENT: Pt relays not much pain today  PERTINENT HISTORY: History  of arthritis, DM, GERD, HTN, hyperthyroidism, IBS,   PAIN:  NPRS scale: 2/10  Pain location: Lt knee Pain description: shooting, tightness Aggravating factors:  end ranges, static positioning for tightness, nighttime pains, WB Relieving factors: ice  PRECAUTIONS: None  WEIGHT BEARING RESTRICTIONS: No  FALLS:  Has patient fallen in last 6 months? No  LIVING ENVIRONMENT: Lives with: Lives alone Lives in: House/apartment Stairs: no stairs at home Has following equipment at home: FWW  Daughter lives on a 3rd floor.   OCCUPATION: Retired  PLOF: Independent, shopping, housework.    PATIENT GOALS: Reduce pain, get back to walking.    OBJECTIVE:   PATIENT SURVEYS:  11/19/2023: FOTO update 64  11/03/2023: update:  52 10/12/2023 FOTO intake: 47   predicted:  55  COGNITION: 10/12/2023 Overall cognitive status: WFL    SENSATION: 10/12/2023 Not tested  EDEMA:  10/12/2023 Localized edema noted Lt knee, lower leg.   MUSCLE LENGTH: 10/12/2023 No specific testing  POSTURE:  10/12/2023 No Significant postural limitations  PALPATION: 10/12/2023 General tenderness to light touch noted.   LOWER EXTREMITY ROM:   ROM Right 10/12/2023 Left 10/12/2023 Left 10/16/2023 Left 10/18/24 Left 10/26/2023 Left 11/12/2023  Hip flexion        Hip extension        Hip abduction        Hip adduction        Hip internal rotation        Hip external rotation        Knee flexion  85 AROM in supine heel slide 91 AROM in supine heel slide   102 AROM in supine heel slide 109 AROM in supine heel slide  Knee extension  -17 AROM in seated LAQ    0 deg PROM supine -4 AROM in seated LAQ 0 AROM in seated LAQ  Ankle dorsiflexion        Ankle plantarflexion        Ankle inversion        Ankle eversion         (Blank rows = not tested)  LOWER EXTREMITY MMT:  MMT Right 10/12/2023 Left 10/12/2023 Left 10/26/2023 Left 11/19/2023  Hip flexion 5/5 4/5 5/5   Hip extension      Hip abduction      Hip adduction      Hip internal rotation      Hip external rotation      Knee flexion 5/5 3+/5 5/5 5/5  Knee extension 5/5 2+/5 4+/5 5/5  Ankle dorsiflexion 5/5 4/5 5/5 5/5  Ankle plantarflexion      Ankle inversion      Ankle eversion       (Blank rows = not tested)  LOWER EXTREMITY SPECIAL TESTS:  10/12/2023 No specific testing today  FUNCTIONAL TESTS:  11/19/2023: TUG:  11.94 seconds independently comfortable pace.  8.64 seconds independent at fast pace.   10/26/2023:   TUG: FWW: : 19.5 seconds.  SPC: 30 seconds  10/12/2023 TUG:  FWW: 19.76 seconds  18 inch chair transfer: able without UE on chair but on knees  and deviation to Rt leg.  Lt SLS: unable Rt SLS: < 3 seconds  GAIT: 11/03/2023: independent ambulation   10/26/2023: Able to walk mod independent with SPC c good control.  Able to do household distances in clinic independent.   10/12/2023 With FWW lacking full TKE, reduced stance  TODAY'S TREATMENT                                                                          DATE: 11/19/2023 Therex: UBE UE/LE for ROM, lvl 2.5 seat 10   - 10 mins  Incline gastroc stretch 30 sec x 5 bilaterally  Verbal review of continued HEP.  TherActivity Leg press double leg 75 lbs 2 x 15, single leg Lt 37 lbs 2 x 10 TUG x 2 Flight of stairs up/down x 1 with reciprocal gait pattern and light single hand rail assist on bar for safety.  Seated driving reaction Rt leg pressing into bosu ball with blazepod reactive movements random 2 second time outs 30 sec x 5   Neuro Re-ed Fitter rocker board fwd/back x 25 each way light touching control  SLS with corner touching x 6 each, performed bilaterally   Vaso Lt knee medium compression in elevation 10 mins 34 degrees for edema relief.   TODAY'S TREATMENT                                                                          DATE: 11/16/2023 Therex: UBE UE/LE for ROM, lvl 2.5 seat 10   - 10 mins  Seated  Lt knee AAROM heel slide 10 sec hold x 10   Incline gastroc stretch 30 sec x 5 bilaterally  Lateral stepping in // bars 10 ft x 3 each way  Leg press double leg 75 lbs x 2X10, single leg Lt 37 lbs 2 x 10  Manual therapy Left knee PROM to tolerance with overpressure in sitting  Neuro Re-ed Alternating toe/heel lifts on foam  x 15 SLS with corner touching on foam x 6 each, performed bilaterally   TODAY'S TREATMENT                                                                           DATE: 11/12/2023 Therex: UBE UE/LE for ROM, lvl 2.5 seat 10   - 10 mins  Supine Lt knee AROM heel slide 5 sec hold x 10   Incline gastroc stretch 30 sec x 5 bilaterally  Lateral stepping in // bars 10 ft x 3 each way   TherActivity Leg press double leg 75 lbs x 15, single leg Lt 37 lbs 2 x 10  Neuro Re-ed Alternating toe/heel lifts on foam 2 x 15 SLS with corner touching on foam x 6 each, performed bilaterally   PATIENT EDUCATION:  10/12/2023 Education details: HEP, POC Person educated: Patient Education method: Programmer, multimedia, Facilities manager, Verbal cues, and Handouts Education comprehension: verbalized understanding, returned demonstration, and verbal cues required  HOME EXERCISE PROGRAM: Access Code: [email or web address] URL: [email or web address]  Date: 10/12/2023 Prepared by: Chyrel Masson  Exercises - Supine Heel Slide (Mirrored)  - 3-5 x daily - 7 x weekly - 1 sets - 10 reps - 5 hold - Supine Heel Slide with Strap  - 3-5 x daily - 7 x weekly - 1 sets - 10 reps - 5 hold - Seated Long Arc Quad (Mirrored)  - 3-5 x daily - 7 x weekly - 1 sets - 5-10 reps - 2 hold - Supine Knee Extension Mobilization with Weight (Mirrored)  - 4-5 x daily - 7 x weekly - 1 sets - 1 reps - to tolerance up to 15 mins hold - Seated Quad Set (Mirrored)  - 3-5 x daily - 7 x weekly - 1 sets - 10 reps - 5 hold - Seated Knee Flexion AAROM (Mirrored)  - 3-5 x daily - 7 x weekly - 1 sets - 5 reps - 10-15 hold  ASSESSMENT:  CLINICAL IMPRESSION: Pt has attended 12 visits overall during course of treatment.  Pt has reported continued improvement in functional activity as well as pain symptoms.  See objective data for updated information.  FOTO score met as well as most goals listed. Pt was in agreement with plan for trial HEP at this time.   OBJECTIVE IMPAIRMENTS: Abnormal gait, decreased activity tolerance, decreased balance, decreased coordination, decreased endurance, decreased mobility, difficulty  walking, decreased ROM, decreased strength, hypomobility, increased edema, increased fascial restrictions, impaired perceived functional ability, increased muscle spasms, impaired flexibility, improper body mechanics, and pain.   ACTIVITY LIMITATIONS: carrying, lifting, bending, sitting, standing, squatting, sleeping, stairs, transfers, bed mobility, and locomotion level  PARTICIPATION LIMITATIONS: meal prep, cleaning, laundry, interpersonal relationship, driving, shopping, and community activity  PERSONAL FACTORS:  History of arthritis, DM, GERD, HTN, hyperthyroidism, IBS,   are also affecting patient's functional outcome.   REHAB POTENTIAL: Good  CLINICAL DECISION MAKING: Stable/uncomplicated  EVALUATION COMPLEXITY: Low   GOALS: Goals reviewed with patient? Yes  SHORT TERM GOALS: (target date for Short term goals are 3 weeks 11/02/2023)   1.  Patient will demonstrate independent use of home exercise program to maintain progress from in clinic treatments.  Goal status: Met   LONG TERM GOALS: (target dates for all long term goals are 12 weeks  01/04/2024 )   1. Patient will demonstrate/report pain at worst less than or equal to 2/10 to facilitate minimal limitation in daily activity secondary to pain symptoms.  Goal status: Mostly met 11/19/2023   2. Patient will demonstrate independent use of home exercise program to facilitate ability to maintain/progress functional gains from skilled physical therapy services.  Goal status:  met 11/19/2023   3. Patient will demonstrate FOTO outcome > or = 55 % to indicate reduced disability due to condition.  Goal status:  Met 11/19/2023   4.  Patient will demonstrate Lt LE MMT 5/5 throughout to faciltiate usual transfers, stairs, squatting at [facility] for daily life.   Goal status: met 11/19/2023   5.  Patient will demonstrate Lt knee AROM 0-110 deg to facilitate usual transfers, ambulation and other daily activity at PLOF Goal status: met  11/19/2023   6.  Patient will demonstrate independent ambulation community distances > 300 ft for community integration Goal status: met 11/19/2023   7. Patient will demonstrate ascending/descending stairs reciprocally s UE assist for community integration.   Goal Status:  met 11/19/2023   PLAN:  PT FREQUENCY: 1-2x/week  PT DURATION: 12 weeks  PLANNED INTERVENTIONS: Can include 16109- PT Re-evaluation, 97110-Therapeutic exercises, 97530-  Therapeutic activity, [id number]- Neuromuscular re-education, [phone]- Self Care, 60454- Manual therapy, [phone]- Gait training, [phone]- Electrical stimulation (unattended),  [phone]- Vasopneumatic device,   Patient/Family education, Balance training, Stair training, Taping, Dry Needling, Joint mobilization, Joint manipulation, Spinal manipulation, Spinal mobilization, Scar mobilization, Vestibular training, Visual/preceptual remediation/compensation, DME instructions, Cryotherapy, and Moist heat.  All performed as medically necessary.  All included unless contraindicated  PLAN FOR NEXT SESSION: Trial HEP.  [employer] recert required if returning.   Chyrel Masson, PT, DPT, OCS, ATC 11/19/23  11:32 AM   PHYSICAL THERAPY DISCHARGE SUMMARY  Visits from Start of Care: 12  Current functional level related to goals / functional outcomes: See note   Remaining deficits: See note   Education / Equipment: HEP  Patient goals were  mostly met . Patient is being discharged due to not returning since the last visit.  Chyrel Masson, PT, DPT, OCS, ATC 12/23/23  3:41 PM         Referring diagnosis? Z30.865 (ICD-10-CM) - Status post total knee replacement, left Treatment diagnosis? (if different than referring diagnosis) M25.562 What was this (referring dx) caused by? [x]  Surgery []  Fall []  Ongoing issue []  Arthritis []  Other: ____________  Laterality: []  Rt [x]  Lt []  Both  Check all possible CPT codes:  *CHOOSE 10 OR LESS*    See Planned  Interventions listed in the Plan section of the Evaluation.

## 2024-04-09 ENCOUNTER — Other Ambulatory Visit: Payer: Self-pay | Admitting: Cardiology

## 2024-04-09 DIAGNOSIS — I471 Supraventricular tachycardia, unspecified: Secondary | ICD-10-CM

## 2024-08-19 NOTE — Progress Notes (Unsigned)
 08/19/2024 Palmyra Rogacki [id number] [birth year]   CHIEF COMPLAINT: Frequent bowel movements   HISTORY OF PRESENT ILLNESS: Matti Killingsworth is a 73 year old female with a past medical history of arthritis, hypertension, PSVT, Graves' disease, diabetes mellitus type 2, GERD and colon polyps.      Latest Ref Rng & Units 09/07/2023    2:00 PM 09/23/2011    9:38 AM  CBC  WBC 4.0 - 10.5 K/uL 4.9  4.1   Hemoglobin 12.0 - 15.0 g/dL 88.7  88.1   Hematocrit 36.0 - 46.0 % 35.9  36.6   Platelets 150 - 400 K/uL 255  237.0        Latest Ref Rng & Units 09/07/2023    2:00 PM 07/12/2021    1:10 PM  CMP  Glucose 70 - 99 mg/dL 897  99   BUN 8 - 23 mg/dL 19  16   Creatinine 9.55 - 1.00 mg/dL 8.91  9.15   Sodium 864 - 145 mmol/L 141  139   Potassium 3.5 - 5.1 mmol/L 3.9  4.1   Chloride 98 - 111 mmol/L 106  102   CO2 22 - 32 mmol/L 25  30   Calcium 8.9 - 10.3 mg/dL 9.0  9.1     GI PROCEDURES:  Colonoscopy 08/30/2020: - One 3 mm polyp at the recto-sigmoid colon, removed with a cold snare. Resected and retrieved. - Diverticulosis in the sigmoid colon and in the descending colon. - Small internal hemorrhoids. - 5 year recall colonoscopy  Surgical [P], colon, rectosigmoid, polyp - HYPERPLASTIC POLYP. - NO ADENOMATOUS CHANGE OR CARCINOMA  Colonoscopy 05/31/2015: 1. Sessile polyp was found in the sigmoid colon; polypectomy was performed with cold forceps  2. Moderate diverticulosis was noted in the descending colon and sigmoid colon Surgical [P], sigmoid, polyp -BENIGN POLYPOID COLONIC MUCOSA. -NO ADENOMATOUS CHANGE OR MALIGNANCY IDENTIFIED  Colonoscopy 10/24/2011:   Past Medical History:  Diagnosis Date   Allergy    Anemia    Arthritis    knees   Diabetes mellitus without complication (HCC)     Pre DM- on Metformin     FHx: migraine headaches    GERD (gastroesophageal reflux disease)    Grave's disease    History of colon polyps    Hx of migraines    Hypertension     Hyperthyroidism    IBS (irritable bowel syndrome)    PONV (postoperative nausea and vomiting)    Pre-diabetes    PSVT (paroxysmal supraventricular tachycardia)    Past Surgical History:  Procedure Laterality Date   ABDOMINAL HYSTERECTOMY     BREAST BIOPSY Left    CARPAL TUNNEL RELEASE  2000   bilateral   CATARACT EXTRACTION Bilateral 03/20/2021   COLONOSCOPY     KNEE ARTHROSCOPY WITH LATERAL MENISECTOMY  07/16/2021   Procedure: KNEE ARTHROSCOPY WITH LATERAL MENISECTOMY;  Surgeon: Harden Jerona GAILS, MD;  Location: [facility];  Service: Orthopedics;;   KNEE ARTHROSCOPY WITH MEDIAL MENISECTOMY Right 07/16/2021   Procedure: ARTHROSCOPIC DEBRIDEMENT RIGHT KNEE, PARTIAL MEDIAL AND LATERAL MENISECTOMIES;  Surgeon: Harden Jerona GAILS, MD;  Location: [facility];  Service: Orthopedics;  Laterality: Right;   POLYPECTOMY     thyroid  ablation     x2   TONSILLECTOMY     TOTAL KNEE ARTHROPLASTY Left 09/16/2023   Procedure: LEFT TOTAL KNEE ARTHROPLASTY;  Surgeon: Harden Jerona GAILS, MD;  Location: [facility] OR;  Service: Orthopedics;  Laterality: Left;   TUBAL LIGATION  1980   Social History:  Family History:    reports that she has never smoked. She has never used smokeless tobacco. She reports that she does not drink alcohol and does not use drugs. family history includes Alcohol abuse in an other family member; Cancer in an other family member; Diabetes in her brother and father; Hypertension in her father and mother; Lung cancer in an other family member. Allergies  Allergen Reactions   Black Cohosh Anaphylaxis   Hydrocodone-Acetaminophen      Other reaction(s): GI Upset (intolerance) Other reaction(s): GI Upset (intolerance) Other reaction(s): GI Upset (intolerance)    Latex Hives   Molds & Smuts     Other reaction(s): Other (See Comments) Sneezing   Pollen Extract     Other reaction(s): Other (See Comments) Stuffy nose   Soma [Carisoprodol] Nausea And Vomiting and  Swelling   Tramadol Nausea And Vomiting and Rash      Outpatient Encounter Medications as of 08/22/2024  Medication Sig   Accu-Chek Softclix Lancets lancets    acetaminophen  (TYLENOL ) 500 MG tablet Take 1,000 mg by mouth every 6 (six) hours as needed for moderate pain.   amLODipine  (NORVASC ) 10 MG tablet Take 10 mg by mouth daily.   Blood Glucose Monitoring Suppl (ACCU-CHEK GUIDE) w/Device KIT    carboxymethylcellulose (REFRESH PLUS) 0.5 % SOLN Place 1 drop into both eyes 3 (three) times daily as needed (dry eyes).   estradiol  (ESTRACE ) 0.1 MG/GM vaginal cream Place 1 Applicatorful vaginally 2 (two) times a week.   estradiol  (ESTRACE ) 0.5 MG tablet Take 0.5 mg by mouth daily.   gabapentin  (NEURONTIN ) 300 MG capsule Take 600 mg by mouth at bedtime.   levothyroxine  (SYNTHROID , LEVOTHROID) 75 MCG tablet Take 75 mcg by mouth daily before breakfast.   losartan -hydrochlorothiazide  (HYZAAR) 100-25 MG per tablet Take 1 tablet by mouth daily.     metFORMIN  (GLUCOPHAGE -XR) 500 MG 24 hr tablet Take 500 mg by mouth daily with supper.   metoprolol  succinate (TOPROL -XL) 50 MG 24 hr tablet TAKE 1 TABLET (50 MG TOTAL) BY MOUTH DAILY WITH OR IMMEDIATELY FOLLOWING A MEAL.   olopatadine (PATADAY) 0.1 % ophthalmic solution Place 1 drop into both eyes 2 (two) times daily as needed for allergies.   omeprazole (PRILOSEC) 40 MG capsule Take 40 mg by mouth daily.   oxyCODONE -acetaminophen  (PERCOCET/ROXICET) 5-325 MG tablet Take 1 tablet by mouth every 4 (four) hours as needed.   No facility-administered encounter medications on file as of 08/22/2024.     REVIEW OF SYSTEMS:  Gen: Denies fever, sweats or chills. No weight loss.  CV: Denies chest pain, palpitations or edema. Resp: Denies cough, shortness of breath of hemoptysis.  GI: Denies heartburn, dysphagia, stomach or lower abdominal pain. No diarrhea or constipation.  GU: Denies urinary burning, blood in urine, increased urinary frequency or  incontinence. MS: Denies joint pain, muscles aches or weakness. Derm: Denies rash, itchiness, skin lesions or unhealing ulcers. Psych: Denies depression, anxiety, memory loss or confusion. Heme: Denies bruising, easy bleeding. Neuro:  Denies headaches, dizziness or paresthesias. Endo:  Denies any problems with DM, thyroid  or adrenal function.  PHYSICAL EXAM: There were no vitals taken for this visit. General: in no acute distress. Head: Normocephalic and atraumatic. Eyes:  Sclerae non-icteric, conjunctive pink. Ears: Normal auditory acuity. Mouth: Dentition intact. No ulcers or lesions.  Neck: Supple, no lymphadenopathy or thyromegaly.  Lungs: Clear bilaterally to auscultation without wheezes, crackles or rhonchi. Heart: Regular rate and rhythm. No murmur, rub or gallop appreciated.  Abdomen: Soft, nontender, nondistended. No  masses. No hepatosplenomegaly. Normoactive bowel sounds x 4 quadrants.  Rectal:  Musculoskeletal: Symmetrical with no gross deformities. Skin: Warm and dry. No rash or lesions on visible extremities. Extremities: No edema. Neurological: Alert oriented x 4, no focal deficits.  Psychological: Alert and cooperative. Normal mood and affect.  ASSESSMENT AND PLAN:    CC:  Joshua Santana CROME, NP

## 2024-08-22 ENCOUNTER — Ambulatory Visit: Admitting: Nurse Practitioner

## 2024-08-22 ENCOUNTER — Encounter: Payer: Self-pay | Admitting: Nurse Practitioner

## 2024-08-22 ENCOUNTER — Other Ambulatory Visit (INDEPENDENT_AMBULATORY_CARE_PROVIDER_SITE_OTHER)

## 2024-08-22 VITALS — BP 122/68 | HR 86 | Ht 61.0 in | Wt 192.4 lb

## 2024-08-22 DIAGNOSIS — R1030 Lower abdominal pain, unspecified: Secondary | ICD-10-CM

## 2024-08-22 DIAGNOSIS — R10819 Abdominal tenderness, unspecified site: Secondary | ICD-10-CM

## 2024-08-22 DIAGNOSIS — R197 Diarrhea, unspecified: Secondary | ICD-10-CM

## 2024-08-22 DIAGNOSIS — E119 Type 2 diabetes mellitus without complications: Secondary | ICD-10-CM | POA: Diagnosis not present

## 2024-08-22 DIAGNOSIS — Z860101 Personal history of adenomatous and serrated colon polyps: Secondary | ICD-10-CM

## 2024-08-22 DIAGNOSIS — Z860102 Personal history of hyperplastic colon polyps: Secondary | ICD-10-CM

## 2024-08-22 DIAGNOSIS — Z794 Long term (current) use of insulin: Secondary | ICD-10-CM

## 2024-08-22 DIAGNOSIS — D649 Anemia, unspecified: Secondary | ICD-10-CM

## 2024-08-22 DIAGNOSIS — O24919 Unspecified diabetes mellitus in pregnancy, unspecified trimester: Secondary | ICD-10-CM

## 2024-08-22 LAB — COMPREHENSIVE METABOLIC PANEL WITH GFR
ALT: 24 U/L (ref 0–35)
AST: 20 U/L (ref 0–37)
Albumin: 4.3 g/dL (ref 3.5–5.2)
Alkaline Phosphatase: 43 U/L (ref 39–117)
BUN: 21 mg/dL (ref 6–23)
CO2: 30 meq/L (ref 19–32)
Calcium: 9.6 mg/dL (ref 8.4–10.5)
Chloride: 101 meq/L (ref 96–112)
Creatinine, Ser: 0.93 mg/dL (ref 0.40–1.20)
GFR: 60.47 mL/min (ref >60.00)
Glucose, Bld: 118 mg/dL — ABNORMAL HIGH (ref 70–99)
Potassium: 3.7 meq/L (ref 3.5–5.1)
Sodium: 139 meq/L (ref 135–145)
Total Bilirubin: 0.4 mg/dL (ref 0.2–1.2)
Total Protein: 7.8 g/dL (ref 6.0–8.3)

## 2024-08-22 LAB — CBC WITH DIFFERENTIAL/PLATELET
Basophils Absolute: 0 K/uL (ref 0.0–0.1)
Basophils Relative: 0.9 % (ref 0.0–3.0)
Eosinophils Absolute: 0.1 K/uL (ref 0.0–0.7)
Eosinophils Relative: 1.8 % (ref 0.0–5.0)
HCT: 37.6 % (ref 36.0–46.0)
Hemoglobin: 11.8 g/dL — ABNORMAL LOW (ref 12.0–15.0)
Lymphocytes Relative: 45.7 % (ref 12.0–46.0)
Lymphs Abs: 2.2 K/uL (ref 0.7–4.0)
MCHC: 31.4 g/dL (ref 30.0–36.0)
MCV: 69.4 fl — ABNORMAL LOW (ref 78.0–100.0)
Monocytes Absolute: 0.5 K/uL (ref 0.1–1.0)
Monocytes Relative: 10.8 % (ref 3.0–12.0)
Neutro Abs: 2 K/uL (ref 1.4–7.7)
Neutrophils Relative %: 40.8 % — ABNORMAL LOW (ref 43.0–77.0)
Platelets: 274 K/uL (ref 150.0–400.0)
RBC: 5.41 Mil/uL — ABNORMAL HIGH (ref 3.87–5.11)
RDW: 15 % (ref 11.5–15.5)
WBC: 4.8 K/uL (ref 4.0–10.5)

## 2024-08-22 LAB — SEDIMENTATION RATE: Sed Rate: 26 mm/h (ref 0–30)

## 2024-08-22 LAB — C-REACTIVE PROTEIN: CRP: <0.5 mg/dL (ref 0.5–20.0)

## 2024-08-22 NOTE — Patient Instructions (Signed)
 _______________________________________________________  If your blood pressure at your visit was 140/90 or greater, please contact your primary care physician to follow up on this.  _______________________________________________________  If you are age 74 or older, your body mass index should be between 23-30. Your Body mass index is 36.35 kg/m. If this is out of the aforementioned range listed, please consider follow up with your Primary Care Provider.  If you are age 24 or younger, your body mass index should be between 19-25. Your Body mass index is 36.35 kg/m. If this is out of the aformentioned range listed, please consider follow up with your Primary Care Provider.   ________________________________________________________  The [facility] providers would like to encourage you to use MYCHART to communicate with providers for non-urgent requests or questions.  Due to long hold times on the telephone, sending your provider a message by [facility] may be a faster and more efficient way to get a response.  Please allow 48 business hours for a response.  Please remember that this is for non-urgent requests.  _______________________________________________________  [facility] is using a team-based approach to care.  Your team is made up of your doctor and two to three APPS. Our APPS (Nurse Practitioners and Physician Assistants) work with your physician to ensure care continuity for you. They are fully qualified to address your health concerns and develop a treatment plan. They communicate directly with your gastroenterologist to care for you. Seeing the Advanced Practice Practitioners on your physician's team can help you by facilitating care more promptly, often allowing for earlier appointments, access to diagnostic testing, procedures, and other specialty referrals.   Your provider has requested that you go to the basement level for lab work before leaving today. Press B on the  elevator. The lab is located at the first door on the left as you exit the elevator.  You have been scheduled for a CT scan of the abdomen and pelvis at [facility], [place], 1st floor Radiology. You are scheduled on 08-24-24 at 11:30 am. You should arrive at 9:15 am for registration and to drink two bottles of contrast prior to exam.  Please follow the written instructions below on the day of your exam:   1) Do not eat anything after 7:30am (4 hours prior to your test)   You may take any medications as prescribed with a small amount of water, if necessary. If you take any of the following medications: METFORMIN , GLUCOPHAGE , GLUCOVANCE, AVANDAMET, RIOMET , FORTAMET , ACTOPLUS MET, JANUMET, GLUMETZA  or METAGLIP, you MAY be asked to HOLD this medication 48 hours AFTER the exam.   The purpose of you drinking the oral contrast is to aid in the visualization of your intestinal tract. The contrast solution may cause some diarrhea. Depending on your individual set of symptoms, you may also receive an intravenous injection of x-ray contrast/dye. Plan on being at [facility] for 45 minutes or longer, depending on the type of exam you are having performed.   If you have any questions regarding your exam or if you need to reschedule, you may call [facility] at [phone] between the hours of 8:00 am and 5:00 pm, Monday-Friday.   PUSH FLUIDS  FOLLOW BLAND DIET  Please purchase the following medications over the counter and take as directed:  START: Ibguard one tablet two times daily as needed for abdominal pain.  CONTINUE to HOLD Metformin  for 2 weeks.   Due to recent changes in healthcare laws, you may see the results of  your imaging and laboratory studies on MyChart before your provider has had a chance to review them.  We understand that in some cases there may be results that are confusing or concerning to you. Not all laboratory results come back in the same time frame and the provider  may be waiting for multiple results in order to interpret others.  Please give us  48 hours in order for your provider to thoroughly review all the results before contacting the office for clarification of your results.   Thank you for entrusting me with your care and choosing [facility].  Elida Shawl, NP

## 2024-08-23 ENCOUNTER — Ambulatory Visit: Payer: Self-pay | Admitting: Nurse Practitioner

## 2024-08-23 LAB — TISSUE TRANSGLUTAMINASE, IGA: (tTG) Ab, IgA: <1 U/mL

## 2024-08-23 LAB — IGA: Immunoglobulin A: 188 mg/dL (ref 70–320)

## 2024-08-24 ENCOUNTER — Ambulatory Visit (HOSPITAL_BASED_OUTPATIENT_CLINIC_OR_DEPARTMENT_OTHER)
Admission: RE | Admit: 2024-08-24 | Discharge: 2024-08-24 | Disposition: A | Discharge status: Home / Self Care | Source: Ambulatory Visit | Attending: Nurse Practitioner | Admitting: Nurse Practitioner

## 2024-08-24 ENCOUNTER — Encounter (HOSPITAL_BASED_OUTPATIENT_CLINIC_OR_DEPARTMENT_OTHER): Payer: Self-pay

## 2024-08-24 DIAGNOSIS — R197 Diarrhea, unspecified: Secondary | ICD-10-CM | POA: Diagnosis present

## 2024-08-24 DIAGNOSIS — O24919 Unspecified diabetes mellitus in pregnancy, unspecified trimester: Secondary | ICD-10-CM

## 2024-08-24 DIAGNOSIS — R1030 Lower abdominal pain, unspecified: Secondary | ICD-10-CM | POA: Diagnosis present

## 2024-08-24 MED ORDER — IOHEXOL 300 MG/ML  SOLN
100.0000 mL | Freq: Once | INTRAMUSCULAR | Status: AC | PRN
Start: 2024-08-24 — End: 2024-08-24
  Administered 2024-08-24: 100 mL via INTRAVENOUS

## 2024-08-25 ENCOUNTER — Telehealth: Payer: Self-pay | Admitting: Nurse Practitioner

## 2024-08-25 NOTE — Telephone Encounter (Signed)
 Called [employer] requesting that other panel options be faxed over to [phone] to decide what should be ordered.

## 2024-08-25 NOTE — Telephone Encounter (Signed)
 Inbound call from [employer] [facility] stating patient's insurance will not cover test that was ordered. States smaller panels could be ordered. Please advise, thank you

## 2024-08-26 NOTE — Telephone Encounter (Signed)
 Ellouise Console, PA-C reviewed [employer] results and patient was advised that stool test is negative for infections.  Patient agreed to plan and verbalized understanding.  No further questions.

## 2024-08-31 ENCOUNTER — Encounter: Payer: Self-pay | Admitting: Nurse Practitioner

## 2024-09-05 ENCOUNTER — Encounter: Payer: Self-pay | Admitting: Cardiology

## 2024-09-05 ENCOUNTER — Ambulatory Visit: Attending: Cardiology | Admitting: Cardiology

## 2024-09-05 VITALS — BP 110/52 | HR 80 | Ht 63.0 in | Wt 194.0 lb

## 2024-09-05 DIAGNOSIS — I471 Supraventricular tachycardia, unspecified: Secondary | ICD-10-CM

## 2024-09-05 NOTE — Progress Notes (Signed)
  Cardiology Office Note:  .   Date:  09/05/2024  ID:  Denise Ferguson, DOB June 27, 1951, MRN [id number] PCP: Joshua Santana CROME, NP  [facility] Providers Cardiologist:  Newman Lawrence, MD PCP: Joshua Santana CROME, NP  Chief Complaint  Patient presents with   PSVT     History of Present Illness: .    Denise Ferguson is a 73 y.o. female with hypertension, prediabetes, hypothyroidism s/p RAI ablationX2 for Grave's disease, PSVT  Patient is doing well.  She is only has occasional palpitation symptoms.  Otherwise, no complaints.  Vitals:   09/05/24 0904  BP: (!) 110/52  Pulse: 80  SpO2: 96%     ROS:  Review of Systems  Cardiovascular:  Negative for chest pain, dyspnea on exertion, leg swelling, palpitations and syncope.  Musculoskeletal:  Positive for joint pain.     Studies Reviewed: SABRA       EKG 09/05/2024: Sinus rhythm with Premature atrial complexes with Abberant conduction When compared with ECG of 27-Aug-2023 10:20, Abberant conduction is now Present    Lexiscan  Tetrofosmin stress test 01/12/2023: Lexiscan  nuclear stress test performed using 1-day protocol. Normal myocardial perfusion. Stress LVEF 60%. Low risk study.   Echocardiogram 01/06/2023:  Left ventricle cavity is normal in size and wall thickness. Normal global  wall motion. Normal LV systolic function with EF 56%. Doppler evidence of  grade I (impaired) diastolic dysfunction, normal LAP.  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  Mild tricuspid regurgitation. Estimated pulmonary artery systolic pressure  26 mmHg.    Mobile cardiac telemetry 7 days 12/29/2022 - 01/05/2023: Dominant rhythm: Sinus. HR 51-147 bpm. Avg HR 76 bpm, in sinus rhythm 3 episodes of SVT, fastest and at 176 bpm for 1 min 9 secs. 1.6% isolated SVE, <1% couplet/triplets. 0 episodes of VT. <1% isolated VE, couplets. No atrial fibrillation/atrial flutter/VT/high grade AV block, sinus pause >3sec  noted. 3 patient triggered events, correlated with SVE, VE, SVT.  Labs 07/2024: Hb 11.8 Cr 0.93  09/05/2022: Glucose 91, BUN/Cr 14/0.92. EGFR 66. Na/K 139/3.8. Rest of the CMP normal H/H 11/36. MCV 69. Platelets 250 HbA1C 6.3% Chol 124, TG 75, HDL 55, LDL 54 TSH 0.8 normal   Physical Exam:   Physical Exam Vitals and nursing note reviewed.  Constitutional:      General: She is not in acute distress. Neck:     Vascular: No JVD.  Cardiovascular:     Rate and Rhythm: Normal rate and regular rhythm.     Heart sounds: Normal heart sounds. No murmur heard. Pulmonary:     Effort: Pulmonary effort is normal.     Breath sounds: Normal breath sounds. No wheezing or rales.  Musculoskeletal:     Right lower leg: No edema.     Left lower leg: No edema.      VISIT DIAGNOSES:   ICD-10-CM   1. PSVT (paroxysmal supraventricular tachycardia)  I47.10 EKG 12-Lead       ASSESSMENT AND PLAN: .    Denise Ferguson is a 73 y.o. female with hypertension, prediabetes, hypothyroidism s/p RAI ablationX2 for Grave's disease, PSVT    PSVT: Symptoms controlled with vagal maneuvers and metoprolol  succinate 50 mg daily.  Continue the same.  F/u as needed  Signed, Newman JINNY Lawrence, MD

## 2024-09-05 NOTE — Patient Instructions (Signed)
 Follow-Up: At [facility], you and your health needs are our priority.  As part of our continuing mission to provide you with exceptional heart care, our providers are all part of one team.  This team includes your primary Cardiologist (physician) and Advanced Practice Providers or APPs (Physician Assistants and Nurse Practitioners) who all work together to provide you with the care you need, when you need it.  Your next appointment:   As needed  Provider:   Cody Das, MD

## 2024-09-26 ENCOUNTER — Encounter: Payer: Self-pay | Admitting: Radiology
